# Patient Record
Sex: Female | Born: 1955 | Race: White | Hispanic: No | Marital: Married | Smoking: Never smoker
Health system: Southern US, Community
[De-identification: ages and names within clinical notes are randomized; demographics above are authoritative.]

## PROBLEM LIST (undated history)

## (undated) DIAGNOSIS — K219 Gastro-esophageal reflux disease without esophagitis: Secondary | ICD-10-CM

## (undated) DIAGNOSIS — K5909 Other constipation: Secondary | ICD-10-CM

## (undated) DIAGNOSIS — K623 Rectal prolapse: Secondary | ICD-10-CM

## (undated) DIAGNOSIS — M1990 Unspecified osteoarthritis, unspecified site: Secondary | ICD-10-CM

## (undated) HISTORY — DX: Rectal prolapse: K62.3

## (undated) HISTORY — PX: EYE SURGERY: SHX253

## (undated) HISTORY — DX: Other constipation: K59.09

## (undated) HISTORY — PX: COLONOSCOPY: SHX174

---

## 1997-08-07 ENCOUNTER — Ambulatory Visit (HOSPITAL_COMMUNITY): Admission: RE | Admit: 1997-08-07 | Discharge: 1997-08-07 | Payer: Self-pay | Admitting: Gynecology

## 1998-01-29 ENCOUNTER — Other Ambulatory Visit: Admission: RE | Admit: 1998-01-29 | Discharge: 1998-01-29 | Payer: Self-pay | Admitting: Gynecology

## 1998-08-05 ENCOUNTER — Ambulatory Visit (HOSPITAL_COMMUNITY): Admission: RE | Admit: 1998-08-05 | Discharge: 1998-08-05 | Payer: Self-pay | Admitting: Gynecology

## 1999-03-05 ENCOUNTER — Other Ambulatory Visit: Admission: RE | Admit: 1999-03-05 | Discharge: 1999-03-05 | Payer: Self-pay | Admitting: Gynecology

## 1999-08-11 ENCOUNTER — Encounter: Payer: Self-pay | Admitting: Gynecology

## 1999-08-11 ENCOUNTER — Ambulatory Visit (HOSPITAL_COMMUNITY): Admission: RE | Admit: 1999-08-11 | Discharge: 1999-08-11 | Payer: Self-pay | Admitting: Gynecology

## 2000-03-09 ENCOUNTER — Other Ambulatory Visit: Admission: RE | Admit: 2000-03-09 | Discharge: 2000-03-09 | Payer: Self-pay | Admitting: Gynecology

## 2000-08-11 ENCOUNTER — Encounter: Payer: Self-pay | Admitting: Gynecology

## 2000-08-11 ENCOUNTER — Ambulatory Visit (HOSPITAL_COMMUNITY): Admission: RE | Admit: 2000-08-11 | Discharge: 2000-08-11 | Payer: Self-pay | Admitting: Gynecology

## 2001-03-14 ENCOUNTER — Other Ambulatory Visit: Admission: RE | Admit: 2001-03-14 | Discharge: 2001-03-14 | Payer: Self-pay | Admitting: Gynecology

## 2001-03-17 ENCOUNTER — Encounter: Admission: RE | Admit: 2001-03-17 | Discharge: 2001-03-17 | Payer: Self-pay | Admitting: Gynecology

## 2001-03-17 ENCOUNTER — Encounter: Payer: Self-pay | Admitting: Gynecology

## 2002-04-14 ENCOUNTER — Other Ambulatory Visit: Admission: RE | Admit: 2002-04-14 | Discharge: 2002-04-14 | Payer: Self-pay | Admitting: Gynecology

## 2003-05-30 ENCOUNTER — Other Ambulatory Visit: Admission: RE | Admit: 2003-05-30 | Discharge: 2003-05-30 | Payer: Self-pay | Admitting: Gynecology

## 2003-07-13 ENCOUNTER — Ambulatory Visit (HOSPITAL_COMMUNITY): Admission: RE | Admit: 2003-07-13 | Discharge: 2003-07-13 | Payer: Self-pay | Admitting: Gynecology

## 2004-07-30 ENCOUNTER — Other Ambulatory Visit: Admission: RE | Admit: 2004-07-30 | Discharge: 2004-07-30 | Payer: Self-pay | Admitting: Gynecology

## 2005-08-28 ENCOUNTER — Ambulatory Visit (HOSPITAL_COMMUNITY): Admission: RE | Admit: 2005-08-28 | Discharge: 2005-08-28 | Payer: Self-pay | Admitting: Gynecology

## 2005-08-31 ENCOUNTER — Other Ambulatory Visit: Admission: RE | Admit: 2005-08-31 | Discharge: 2005-08-31 | Payer: Self-pay | Admitting: Gynecology

## 2006-08-31 ENCOUNTER — Ambulatory Visit (HOSPITAL_COMMUNITY): Admission: RE | Admit: 2006-08-31 | Discharge: 2006-08-31 | Payer: Self-pay | Admitting: Gynecology

## 2006-09-06 ENCOUNTER — Other Ambulatory Visit: Admission: RE | Admit: 2006-09-06 | Discharge: 2006-09-06 | Payer: Self-pay | Admitting: Gynecology

## 2007-09-09 ENCOUNTER — Ambulatory Visit (HOSPITAL_COMMUNITY): Admission: RE | Admit: 2007-09-09 | Discharge: 2007-09-09 | Payer: Self-pay | Admitting: Gynecology

## 2008-09-14 ENCOUNTER — Ambulatory Visit (HOSPITAL_COMMUNITY): Admission: RE | Admit: 2008-09-14 | Discharge: 2008-09-14 | Payer: Self-pay | Admitting: Gynecology

## 2009-03-26 ENCOUNTER — Ambulatory Visit: Payer: Self-pay | Admitting: Gastroenterology

## 2009-03-27 DIAGNOSIS — R1319 Other dysphagia: Secondary | ICD-10-CM | POA: Insufficient documentation

## 2009-04-08 ENCOUNTER — Ambulatory Visit (HOSPITAL_COMMUNITY): Admission: RE | Admit: 2009-04-08 | Discharge: 2009-04-08 | Payer: Self-pay | Admitting: Gastroenterology

## 2009-04-08 ENCOUNTER — Ambulatory Visit: Payer: Self-pay | Admitting: Gastroenterology

## 2009-05-21 ENCOUNTER — Ambulatory Visit: Payer: Self-pay | Admitting: Gastroenterology

## 2009-05-21 DIAGNOSIS — R1013 Epigastric pain: Secondary | ICD-10-CM

## 2009-05-21 DIAGNOSIS — K3189 Other diseases of stomach and duodenum: Secondary | ICD-10-CM | POA: Insufficient documentation

## 2009-06-08 DIAGNOSIS — K219 Gastro-esophageal reflux disease without esophagitis: Secondary | ICD-10-CM | POA: Insufficient documentation

## 2009-09-16 ENCOUNTER — Ambulatory Visit (HOSPITAL_COMMUNITY): Admission: RE | Admit: 2009-09-16 | Discharge: 2009-09-16 | Payer: Self-pay | Admitting: Gynecology

## 2010-01-06 ENCOUNTER — Ambulatory Visit: Payer: Self-pay | Admitting: Gastroenterology

## 2010-03-11 NOTE — Assessment & Plan Note (Signed)
Summary: DYSPHAGIA, DYSPEPSIA   Visit Type:  Follow-up Visit Primary Care Provider:  Sherril Croon, M.D.  Chief Complaint:  dysphagia.  History of Present Illness: Had a late supper yesterday. Pretty good fo rmost part. May have breakthrough Sx once a week. Takes two times a day when can remember. Recently went to beach and had more EtOH than usual-Margarita[employer] 2/day.  Current Medications (verified): 1)  Omeprazole 20 Mg Cpdr (Omeprazole) .... Take 1 Tablet By Mouth Two Times A Day 2)  Trazodone Hcl 50 Mg Tabs (Trazodone Hcl) .... 1/2 Tablet At Bedtime 3)  Multi-Vitamin .... Take 1 Tablet By Mouth Once A Day 4)  Vitamin D .... Take 1 Tablet By Mouth Once A Day 5)  Calcium 600 Mg Plus D .... Take 1 Tablet By Mouth Once A Day  Allergies (verified): 1)  ! 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  Vital Signs:  Patient profile:   55 year old female Height:      60.5 inches Weight:      124 pounds BMI:     23.90 Temp:     98.1 degrees F oral BP sitting:   110 / 70  (left arm) Cuff size:   regular  Vitals Entered By: Cloria Spring LPN (May 21, 2009 4:32 PM)  Physical Exam  General:  Well developed, well nourished, no acute distress. Head:  Normocephalic and atraumatic. Eyes:  PERRLA, no icterus. Mouth:  No deformity or lesions, dentition normal. Abdomen:  Soft, nontender and nondistended. Normal bowel sounds.  Impression & Recommendations:  Problem # 1:  DYSPHAGIA (ZOX-096.04) Assessment Improved Continue OMP minimum once every AM. Diet modification. Use OMP two times a day if having a flare.  Problem # 2:  DYSPEPSIA (ICD-536.8) Assessment: Improved Likely 2o to partially uncontrolled GERD and/or gastritis. Avoid gastric irritants. OPV in 4 mos.  CC:  PCP  Problem # 3:  SCREENING, COLON CANCER (ICD-V76.51) Assessment: Comment Only OBTAIN [facility] TCS REPORT.  cc: PCP  Other Orders: Est. Patient Level II (54098)     Appended Document: DYSPHAGIA, DYSPEPSIA 60m reminder appt made- cdg    Appended Document: Orders Update    Clinical Lists Changes  Orders: Added new Service order of Est. Patient Level II (11914) - Signed

## 2010-03-11 NOTE — Assessment & Plan Note (Signed)
Summary: DYSPHAGIA   Visit Type:  Initial Consult Primary Care Provider:  Sherril Croon, M.D.  Chief Complaint:  Feels like lump in throat.  History of Present Illness: This summer lump in her throat, hoarseness. OMP has helped. Stress test perfect. Still bothers her. Hurts in upr chest at night or when she lays down. No EGD. Feels heartburn: 2-3x/week. Gets the burps and s/t hb goes away with burping. Eats "healthy". No abd pain, diarrhea, or constipation. Real regular Intentional weight loss. No cigs. Rare glass wine. No smoked foods. Not always related to foods. Have had plapitations at night. Doesn't really matter of solids or liquids. OMP has helped but Sx persist ever now and then.   Current Medications (verified): 1)  Omeprazole 20 Mg Cpdr (Omeprazole) .... Take 1 Tablet By Mouth Once A Day 2)  Trazodone Hcl 50 Mg Tabs (Trazodone Hcl) .... 1/2 Tablet At Bedtime 3)  Multi-Vitamin .... Take 1 Tablet By Mouth Once A Day 4)  Vitamin D .... Take 1 Tablet By Mouth Once A Day  Allergies (verified): 1)  ! * Pencillin  Past History:  Past Medical History: Allergies Menopause TCS: NUR-8 years ago  Past Surgical History: No surgery  Family History: No Family History of Breast Cancer: No Family History of Colon Cancer: No FH of Colon Cancer or polyps No Family History of Ovarian Cancer: No Family History of Pancreatic Cancer: father has had esophagus stretched.  Social History: Occupation: principal asst at high school Married x 22 y 2 kids age 70 and 41  Vital Signs:  Patient profile:   55 year old female Height:      60.5 inches Weight:      126 pounds BMI:     24.29 Temp:     98.3 degrees F oral Pulse rate:   72 / minute BP sitting:   98 / 62  (left arm) Cuff size:   regular  Vitals Entered By: Cloria Spring LPN (March 26, 2009 3:09 PM)  Physical Exam  General:  Well developed, well nourished, no acute distress. Head:  Normocephalic and atraumatic. Lungs:  Clear  throughout to auscultation. Heart:  Regular rate and rhythm; no murmurs Abdomen:  Soft, nontender and nondistended.  Normal bowel sounds. Extremities:  No edema noted. Neurologic:  Alert and  oriented x4;  grossly normal neurologically.  Impression & Recommendations:  Problem # 1:  DYSPHAGIA (ICD-787.29) Assessment New Differetial diagnosis includes stricture, ring, and less likely esophageal mass. EGD in 1-2 weeks. Continue OMP. OPV in 2 mos.  Orders: Consultation Level III (11914)  CC: PCP

## 2010-03-11 NOTE — Letter (Signed)
Summary: EGD ORDER  EGD ORDER   Imported By: Ave Filter 03/26/2009 15:58:20  _____________________________________________________________________  External Attachment:    Type:   Image     Comment:   External Document

## 2010-03-13 NOTE — Assessment & Plan Note (Signed)
Summary: GERD, DYSPHAGIA   Visit Type:  Follow-up Visit Primary Care Provider:  Sherril Croon, M.D.  Chief Complaint:  follow up- doing ok.  History of Present Illness: No real questions. Taking OMP once a day because the insurance co wouldn't pay for two times a day. Having breakthrough Sx once a week. Foods causing Sx: chocolate, ? caffeine (> 2-3 cups a day). Forgot to take meds and Sx worse on today. usu takes her meds with breakfast. Problems with chest pain and swallowing now tha Sx not ideally controlled.   Current Medications (verified): 1)  Omeprazole 20 Mg Cpdr (Omeprazole) .... Once Daily 2)  Trazodone Hcl 50 Mg Tabs (Trazodone Hcl) .... 1/2 Tablet At Bedtime As Needed 3)  Vitamin D .... Take 1 Tablet By Mouth Once A Day 4)  Calcium 600 Mg Plus D .... Take 1 Tablet By Mouth Once A Day  Allergies (verified): 1)  ! 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 Head:  Normocephalic and atraumatic. Lungs:  Clear throughout to auscultation. Heart:  Regular rate and rhythm; no murmurs. Abdomen:  Soft, nontender and nondistended. Normal bowel sounds.  Impression & Recommendations:  Problem # 1:  GERD (ICD-530.81) Assessment Improved Continue OMP daily. Meds refilled x 1 year. OPV in 12 mos.  Problem # 2:  DYSPHAGIA (VWU-981.19) Assessment: Comment Only Resolved.  CC: PCP Prescriptions: OMEPRAZOLE 20 MG CPDR (OMEPRAZOLE) once daily  #30 x  11   Entered and Authorized by:   West Bali MD   Signed by:   West Bali MD on 01/06/2010   Method used:   Electronically to        [facility] (retail)       [address]       [place], [place]  [address]       Ph: [phone] or [phone]       Fax: [phone]   RxID:   [id number]   Appended Document: Orders Update    Clinical Lists Changes  Orders: Added new Service order of Est. Patient Level II (95638) - Signed

## 2010-04-28 ENCOUNTER — Encounter: Payer: Self-pay | Admitting: Urgent Care

## 2010-05-08 NOTE — Medication Information (Signed)
Summary: PA for omeprazole  PA for omeprazole   Imported By: Hendricks Limes LPN 16/11/9602 54:09:81  _____________________________________________________________________  External Attachment:    Type:   Image     Comment:   External Document

## 2010-08-11 ENCOUNTER — Other Ambulatory Visit (HOSPITAL_COMMUNITY): Payer: Self-pay | Admitting: Gynecology

## 2010-08-11 DIAGNOSIS — Z139 Encounter for screening, unspecified: Secondary | ICD-10-CM

## 2010-09-22 ENCOUNTER — Ambulatory Visit (HOSPITAL_COMMUNITY)
Admission: RE | Admit: 2010-09-22 | Discharge: 2010-09-22 | Disposition: A | Discharge status: Home / Self Care | Payer: BC Managed Care – PPO | Source: Ambulatory Visit | Attending: Gynecology | Admitting: Gynecology

## 2010-09-22 DIAGNOSIS — Z139 Encounter for screening, unspecified: Secondary | ICD-10-CM

## 2010-09-22 DIAGNOSIS — Z1231 Encounter for screening mammogram for malignant neoplasm of breast: Secondary | ICD-10-CM | POA: Insufficient documentation

## 2010-11-10 ENCOUNTER — Encounter: Payer: Self-pay | Admitting: Gastroenterology

## 2010-12-08 ENCOUNTER — Other Ambulatory Visit: Payer: Self-pay | Admitting: Gynecology

## 2011-10-06 ENCOUNTER — Other Ambulatory Visit (HOSPITAL_COMMUNITY): Payer: Self-pay | Admitting: Gynecology

## 2011-10-06 DIAGNOSIS — Z139 Encounter for screening, unspecified: Secondary | ICD-10-CM

## 2011-10-19 ENCOUNTER — Ambulatory Visit (HOSPITAL_COMMUNITY)
Admission: RE | Admit: 2011-10-19 | Discharge: 2011-10-19 | Disposition: A | Discharge status: Home / Self Care | Payer: BC Managed Care – PPO | Source: Ambulatory Visit | Attending: Gynecology | Admitting: Gynecology

## 2011-10-19 DIAGNOSIS — Z139 Encounter for screening, unspecified: Secondary | ICD-10-CM

## 2011-10-19 DIAGNOSIS — Z1231 Encounter for screening mammogram for malignant neoplasm of breast: Secondary | ICD-10-CM | POA: Insufficient documentation

## 2012-01-01 ENCOUNTER — Other Ambulatory Visit: Payer: Self-pay | Admitting: Gynecology

## 2012-09-23 ENCOUNTER — Other Ambulatory Visit (HOSPITAL_COMMUNITY): Payer: Self-pay | Admitting: Internal Medicine

## 2012-09-23 DIAGNOSIS — Z139 Encounter for screening, unspecified: Secondary | ICD-10-CM

## 2012-10-24 ENCOUNTER — Ambulatory Visit (HOSPITAL_COMMUNITY)
Admission: RE | Admit: 2012-10-24 | Discharge: 2012-10-24 | Disposition: A | Discharge status: Home / Self Care | Payer: BC Managed Care – PPO | Source: Ambulatory Visit | Attending: Internal Medicine | Admitting: Internal Medicine

## 2012-10-24 DIAGNOSIS — Z1231 Encounter for screening mammogram for malignant neoplasm of breast: Secondary | ICD-10-CM | POA: Insufficient documentation

## 2012-10-24 DIAGNOSIS — Z139 Encounter for screening, unspecified: Secondary | ICD-10-CM

## 2013-11-14 ENCOUNTER — Other Ambulatory Visit (HOSPITAL_COMMUNITY): Payer: Self-pay | Admitting: Obstetrics and Gynecology

## 2013-11-14 DIAGNOSIS — Z139 Encounter for screening, unspecified: Secondary | ICD-10-CM

## 2013-12-06 ENCOUNTER — Ambulatory Visit (HOSPITAL_COMMUNITY)
Admission: RE | Admit: 2013-12-06 | Discharge: 2013-12-06 | Disposition: A | Discharge status: Home / Self Care | Payer: BC Managed Care – PPO | Source: Ambulatory Visit | Attending: Obstetrics and Gynecology | Admitting: Obstetrics and Gynecology

## 2013-12-06 DIAGNOSIS — Z1231 Encounter for screening mammogram for malignant neoplasm of breast: Secondary | ICD-10-CM | POA: Insufficient documentation

## 2013-12-06 DIAGNOSIS — Z139 Encounter for screening, unspecified: Secondary | ICD-10-CM

## 2013-12-07 ENCOUNTER — Ambulatory Visit (HOSPITAL_COMMUNITY): Payer: BC Managed Care – PPO

## 2015-03-28 ENCOUNTER — Other Ambulatory Visit (HOSPITAL_COMMUNITY): Payer: Self-pay | Admitting: Obstetrics and Gynecology

## 2015-03-28 DIAGNOSIS — Z1231 Encounter for screening mammogram for malignant neoplasm of breast: Secondary | ICD-10-CM

## 2015-04-15 ENCOUNTER — Ambulatory Visit (HOSPITAL_COMMUNITY)
Admission: RE | Admit: 2015-04-15 | Discharge: 2015-04-15 | Disposition: A | Discharge status: Home / Self Care | Payer: BC Managed Care – PPO | Source: Ambulatory Visit | Attending: Obstetrics and Gynecology | Admitting: Obstetrics and Gynecology

## 2015-04-15 DIAGNOSIS — Z1231 Encounter for screening mammogram for malignant neoplasm of breast: Secondary | ICD-10-CM | POA: Diagnosis present

## 2015-04-19 ENCOUNTER — Other Ambulatory Visit: Payer: Self-pay | Admitting: Obstetrics and Gynecology

## 2015-04-19 DIAGNOSIS — R928 Other abnormal and inconclusive findings on diagnostic imaging of breast: Secondary | ICD-10-CM

## 2015-05-01 ENCOUNTER — Ambulatory Visit
Admission: RE | Admit: 2015-05-01 | Discharge: 2015-05-01 | Disposition: A | Discharge status: Home / Self Care | Payer: BC Managed Care – PPO | Source: Ambulatory Visit | Attending: Obstetrics and Gynecology | Admitting: Obstetrics and Gynecology

## 2015-05-01 DIAGNOSIS — R928 Other abnormal and inconclusive findings on diagnostic imaging of breast: Secondary | ICD-10-CM

## 2019-01-19 ENCOUNTER — Other Ambulatory Visit: Payer: Self-pay

## 2019-01-19 DIAGNOSIS — Z20822 Contact with and (suspected) exposure to covid-19: Secondary | ICD-10-CM

## 2019-01-20 LAB — NOVEL CORONAVIRUS, NAA: SARS-CoV-2, NAA: DETECTED — AB

## 2019-04-09 ENCOUNTER — Other Ambulatory Visit: Payer: Self-pay

## 2019-04-09 ENCOUNTER — Ambulatory Visit: Payer: BC Managed Care – PPO | Attending: Internal Medicine

## 2019-04-09 DIAGNOSIS — Z23 Encounter for immunization: Secondary | ICD-10-CM | POA: Insufficient documentation

## 2019-04-09 NOTE — Progress Notes (Signed)
   Covid-19 Vaccination Clinic  Name:  Kathy Bennett    MRN: [id number] DOB: Feb 13, 1955  04/09/2019  Ms. [name] was observed post Covid-19 immunization for 15 minutes without incidence. She was provided with Vaccine Information Sheet and instruction to access the V-Safe system.   Ms. [name] was instructed to call 911 with any severe reactions post vaccine: [name] Kitchen Difficulty breathing  . Swelling of your face and throat  . A fast heartbeat  . A bad rash all over your body  . Dizziness and weakness    Immunizations Administered    Name Date Dose VIS Date Route   Moderna COVID-19 Vaccine 04/09/2019  9:36 AM 0.5 mL 01/10/2019 Intramuscular   Manufacturer: Moderna   Lot: [id number]   [name]

## 2019-05-13 ENCOUNTER — Ambulatory Visit: Payer: BC Managed Care – PPO | Attending: Internal Medicine

## 2019-05-13 DIAGNOSIS — Z23 Encounter for immunization: Secondary | ICD-10-CM

## 2019-05-13 NOTE — Progress Notes (Signed)
   Covid-19 Vaccination Clinic  Name:  Kathy Bennett    MRN: [id number] DOB: 1955-07-16  05/13/2019  [name] was observed post Covid-19 immunization for 15 minutes without incident. She was provided with Vaccine Information Sheet and instruction to access the V-Safe system.   [name] was instructed to call 911 with any severe reactions post vaccine: [name] Kitchen Difficulty breathing  . Swelling of face and throat  . A fast heartbeat  . A bad rash all over body  . Dizziness and weakness   Immunizations Administered    Name Date Dose VIS Date Route   Moderna COVID-19 Vaccine 05/13/2019  9:29 AM 0.5 mL 01/10/2019 Intramuscular   Manufacturer: Moderna   Lot: [id number]   [id number]

## 2019-10-24 ENCOUNTER — Other Ambulatory Visit (HOSPITAL_COMMUNITY): Payer: Self-pay | Admitting: Obstetrics and Gynecology

## 2019-10-24 DIAGNOSIS — Z1211 Encounter for screening for malignant neoplasm of colon: Secondary | ICD-10-CM | POA: Diagnosis not present

## 2019-10-24 DIAGNOSIS — E2839 Other primary ovarian failure: Secondary | ICD-10-CM | POA: Diagnosis not present

## 2019-10-24 DIAGNOSIS — E559 Vitamin D deficiency, unspecified: Secondary | ICD-10-CM | POA: Diagnosis not present

## 2019-10-24 DIAGNOSIS — Z7189 Other specified counseling: Secondary | ICD-10-CM | POA: Diagnosis not present

## 2019-10-24 DIAGNOSIS — R5383 Other fatigue: Secondary | ICD-10-CM | POA: Diagnosis not present

## 2019-10-24 DIAGNOSIS — Z1339 Encounter for screening examination for other mental health and behavioral disorders: Secondary | ICD-10-CM | POA: Diagnosis not present

## 2019-10-24 DIAGNOSIS — Z6823 Body mass index (BMI) 23.0-23.9, adult: Secondary | ICD-10-CM | POA: Diagnosis not present

## 2019-10-24 DIAGNOSIS — Z1231 Encounter for screening mammogram for malignant neoplasm of breast: Secondary | ICD-10-CM

## 2019-10-24 DIAGNOSIS — Z79899 Other long term (current) drug therapy: Secondary | ICD-10-CM | POA: Diagnosis not present

## 2019-10-24 DIAGNOSIS — Z0000 Encounter for general adult medical examination without abnormal findings: Secondary | ICD-10-CM | POA: Diagnosis not present

## 2019-10-24 DIAGNOSIS — Z299 Encounter for prophylactic measures, unspecified: Secondary | ICD-10-CM | POA: Diagnosis not present

## 2019-10-24 DIAGNOSIS — Z1331 Encounter for screening for depression: Secondary | ICD-10-CM | POA: Diagnosis not present

## 2019-11-06 DIAGNOSIS — L820 Inflamed seborrheic keratosis: Secondary | ICD-10-CM | POA: Diagnosis not present

## 2019-11-06 DIAGNOSIS — C44311 Basal cell carcinoma of skin of nose: Secondary | ICD-10-CM | POA: Diagnosis not present

## 2019-11-06 DIAGNOSIS — I781 Nevus, non-neoplastic: Secondary | ICD-10-CM | POA: Diagnosis not present

## 2019-11-09 DIAGNOSIS — R69 Illness, unspecified: Secondary | ICD-10-CM | POA: Diagnosis not present

## 2019-11-23 DIAGNOSIS — R3915 Urgency of urination: Secondary | ICD-10-CM | POA: Diagnosis not present

## 2019-11-23 DIAGNOSIS — G4700 Insomnia, unspecified: Secondary | ICD-10-CM | POA: Diagnosis not present

## 2019-11-23 DIAGNOSIS — Z01419 Encounter for gynecological examination (general) (routine) without abnormal findings: Secondary | ICD-10-CM | POA: Diagnosis not present

## 2019-11-23 DIAGNOSIS — N951 Menopausal and female climacteric states: Secondary | ICD-10-CM | POA: Diagnosis not present

## 2019-12-04 ENCOUNTER — Ambulatory Visit (HOSPITAL_COMMUNITY): Payer: BC Managed Care – PPO

## 2019-12-11 DIAGNOSIS — C4441 Basal cell carcinoma of skin of scalp and neck: Secondary | ICD-10-CM | POA: Diagnosis not present

## 2019-12-11 DIAGNOSIS — C44311 Basal cell carcinoma of skin of nose: Secondary | ICD-10-CM | POA: Diagnosis not present

## 2019-12-18 ENCOUNTER — Other Ambulatory Visit: Payer: Self-pay

## 2019-12-18 ENCOUNTER — Ambulatory Visit (HOSPITAL_COMMUNITY)
Admission: RE | Admit: 2019-12-18 | Discharge: 2019-12-18 | Disposition: A | Discharge status: Home / Self Care | Payer: Medicare HMO | Source: Ambulatory Visit | Attending: Obstetrics and Gynecology | Admitting: Obstetrics and Gynecology

## 2019-12-18 DIAGNOSIS — Z1231 Encounter for screening mammogram for malignant neoplasm of breast: Secondary | ICD-10-CM | POA: Diagnosis not present

## 2019-12-19 DIAGNOSIS — N959 Unspecified menopausal and perimenopausal disorder: Secondary | ICD-10-CM | POA: Diagnosis not present

## 2019-12-19 DIAGNOSIS — E2839 Other primary ovarian failure: Secondary | ICD-10-CM | POA: Diagnosis not present

## 2019-12-28 DIAGNOSIS — H40003 Preglaucoma, unspecified, bilateral: Secondary | ICD-10-CM | POA: Diagnosis not present

## 2020-02-12 DIAGNOSIS — Z08 Encounter for follow-up examination after completed treatment for malignant neoplasm: Secondary | ICD-10-CM | POA: Diagnosis not present

## 2020-02-12 DIAGNOSIS — L308 Other specified dermatitis: Secondary | ICD-10-CM | POA: Diagnosis not present

## 2020-02-12 DIAGNOSIS — Z85828 Personal history of other malignant neoplasm of skin: Secondary | ICD-10-CM | POA: Diagnosis not present

## 2020-05-21 DIAGNOSIS — H5210 Myopia, unspecified eye: Secondary | ICD-10-CM | POA: Diagnosis not present

## 2020-05-29 DIAGNOSIS — Z0100 Encounter for examination of eyes and vision without abnormal findings: Secondary | ICD-10-CM | POA: Diagnosis not present

## 2020-07-23 DIAGNOSIS — Z20822 Contact with and (suspected) exposure to covid-19: Secondary | ICD-10-CM | POA: Diagnosis not present

## 2020-09-16 DIAGNOSIS — L308 Other specified dermatitis: Secondary | ICD-10-CM | POA: Diagnosis not present

## 2020-09-16 DIAGNOSIS — R208 Other disturbances of skin sensation: Secondary | ICD-10-CM | POA: Diagnosis not present

## 2020-09-16 DIAGNOSIS — C44311 Basal cell carcinoma of skin of nose: Secondary | ICD-10-CM | POA: Diagnosis not present

## 2020-10-17 DIAGNOSIS — Z85828 Personal history of other malignant neoplasm of skin: Secondary | ICD-10-CM | POA: Diagnosis not present

## 2020-10-17 DIAGNOSIS — Z08 Encounter for follow-up examination after completed treatment for malignant neoplasm: Secondary | ICD-10-CM | POA: Diagnosis not present

## 2020-10-31 DIAGNOSIS — Z789 Other specified health status: Secondary | ICD-10-CM | POA: Diagnosis not present

## 2020-10-31 DIAGNOSIS — E7800 Pure hypercholesterolemia, unspecified: Secondary | ICD-10-CM | POA: Diagnosis not present

## 2020-10-31 DIAGNOSIS — Z7189 Other specified counseling: Secondary | ICD-10-CM | POA: Diagnosis not present

## 2020-10-31 DIAGNOSIS — Z299 Encounter for prophylactic measures, unspecified: Secondary | ICD-10-CM | POA: Diagnosis not present

## 2020-10-31 DIAGNOSIS — R5383 Other fatigue: Secondary | ICD-10-CM | POA: Diagnosis not present

## 2020-10-31 DIAGNOSIS — Z1339 Encounter for screening examination for other mental health and behavioral disorders: Secondary | ICD-10-CM | POA: Diagnosis not present

## 2020-10-31 DIAGNOSIS — Z6823 Body mass index (BMI) 23.0-23.9, adult: Secondary | ICD-10-CM | POA: Diagnosis not present

## 2020-10-31 DIAGNOSIS — Z79899 Other long term (current) drug therapy: Secondary | ICD-10-CM | POA: Diagnosis not present

## 2020-10-31 DIAGNOSIS — Z1331 Encounter for screening for depression: Secondary | ICD-10-CM | POA: Diagnosis not present

## 2020-10-31 DIAGNOSIS — Z0000 Encounter for general adult medical examination without abnormal findings: Secondary | ICD-10-CM | POA: Diagnosis not present

## 2020-11-06 ENCOUNTER — Encounter (INDEPENDENT_AMBULATORY_CARE_PROVIDER_SITE_OTHER): Payer: Self-pay | Admitting: *Deleted

## 2020-11-15 ENCOUNTER — Encounter: Payer: Self-pay | Admitting: *Deleted

## 2020-11-15 ENCOUNTER — Other Ambulatory Visit (HOSPITAL_COMMUNITY): Payer: Self-pay | Admitting: Internal Medicine

## 2020-11-15 DIAGNOSIS — Z1231 Encounter for screening mammogram for malignant neoplasm of breast: Secondary | ICD-10-CM

## 2020-11-25 ENCOUNTER — Telehealth: Payer: Self-pay | Admitting: Internal Medicine

## 2020-11-25 NOTE — Telephone Encounter (Signed)
Patient did not receive her questionairrem, please mail her another

## 2020-11-25 NOTE — Telephone Encounter (Signed)
Pt has nurse visit scheduled for 12/16/2020 at 3:00.

## 2020-12-10 ENCOUNTER — Ambulatory Visit: Payer: BC Managed Care – PPO

## 2020-12-16 ENCOUNTER — Other Ambulatory Visit: Payer: Self-pay

## 2020-12-16 ENCOUNTER — Ambulatory Visit (INDEPENDENT_AMBULATORY_CARE_PROVIDER_SITE_OTHER): Payer: Self-pay | Admitting: *Deleted

## 2020-12-16 ENCOUNTER — Encounter: Payer: Self-pay | Admitting: *Deleted

## 2020-12-16 VITALS — Ht 60.5 in | Wt 123.2 lb

## 2020-12-16 DIAGNOSIS — Z86010 Personal history of colonic polyps: Secondary | ICD-10-CM

## 2020-12-16 MED ORDER — NA SULFATE-K SULFATE-MG SULF 17.5-3.13-1.6 GM/177ML PO SOLN: 1.0000 | Freq: Once | ORAL | 0 refills | Status: AC

## 2020-12-16 NOTE — Progress Notes (Addendum)
Faxed request for records to [facility] to obtain last TCS report.  Pt had EGD done in 2011 by Dr. Oneida Alar.  EGD report in Epic.

## 2020-12-16 NOTE — Progress Notes (Addendum)
Gastroenterology Pre-Procedure Review  Request Date: 12/16/2020 Requesting Physician: Dr. Woody Seller @ [facility], Last TCS 12/31/2014 done by Dr. 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 Diabetes Melitis: no 2. Joint replacements in the past 12 months: no 3. Major health problems in the past 3 months: no 4. Has an artificial valve or MVP: no 5. Has a defibrillator: no 6. Has been advised in past to take antibiotics in advance of a procedure like teeth cleaning: no 7. Family history of colon cancer: no 8. Alcohol Use: yes, 2 glasses of wine a month 9. Illicit drug Use: no 10. History of sleep apnea: no 11. History of coronary artery or other vascular stents placed within the last 12 months: no 12. History of any prior anesthesia complications: no 13. Body mass index is 23.66 kg/m.    MEDICATIONS & ALLERGIES:    Patient reports the following regarding taking any blood thinners:   Plavix? no Aspirin? no Coumadin? no Brilinta? no Xarelto? no Eliquis? no Pradaxa? no Savaysa? no Effient? no  Patient confirms/reports the following medications:  Current Outpatient Medications  Medication Sig Dispense Refill   CALCIUM-VITAMIN D PO Take by mouth daily at 6 (six) AM.     No current facility-administered medications for this visit.    Patient confirms/reports the following allergies:  Allergies  Allergen Reactions   Penicillins     No orders of the defined types were placed in this encounter.   AUTHORIZATION INFORMATION Primary Insurance: [employer],  ID #: [id number],  Group #: [id number] [place] Pre-Cert / Auth required:  No, not required  SCHEDULE INFORMATION: Procedure has been scheduled as follows:  Date: 01/21/2021, Time: 9:45  Location: [facility] with Dr.  Abbey Chatters  This Gastroenterology Pre-Precedure Review Form is being routed to the following provider(s): Aliene Altes, PA-C

## 2020-12-17 ENCOUNTER — Telehealth: Payer: Self-pay | Admitting: *Deleted

## 2020-12-17 NOTE — Telephone Encounter (Signed)
Spoke to pt.  She wanted to know alternative preps so she could check with her pharmacy to see the best cost for her.  She really did not want to do Trilyte.  She told me to keep everything as is for now and would let me know if she wants me to change it.

## 2020-12-17 NOTE — Progress Notes (Signed)
Will await records from [place] for review.

## 2020-12-17 NOTE — Telephone Encounter (Signed)
[phone]  please call patient, she has insurance concerns

## 2020-12-19 ENCOUNTER — Encounter: Payer: Self-pay | Admitting: *Deleted

## 2020-12-19 NOTE — Progress Notes (Signed)
Received records and placed in [place], [place] office for review.

## 2020-12-23 NOTE — Progress Notes (Deleted)
Records have been received.  Placed in Aliene Altes, PA-C's office for review.

## 2020-12-24 NOTE — Progress Notes (Signed)
Received and reviewed colonoscopy records from November 2016.  Patient had a 6 mm sessile polyp in the sigmoid colon resected and retrieved, patchy area of moderately erythematous mucosa in the rectum biopsied, normal TI and otherwise normal normal exam including normal TI.  Pathology with tubular adenoma and benign rectal mucosa with ischemic pattern injury, no chronic colitis or granulomatosis inflammation. Recommended repeat in 5 years.   OK to schedule colonoscopy. ASA I/II

## 2020-12-25 ENCOUNTER — Other Ambulatory Visit: Payer: Self-pay

## 2020-12-25 ENCOUNTER — Ambulatory Visit (HOSPITAL_COMMUNITY)
Admission: RE | Admit: 2020-12-25 | Discharge: 2020-12-25 | Disposition: A | Discharge status: Home / Self Care | Payer: Medicare HMO | Source: Ambulatory Visit | Attending: Internal Medicine | Admitting: Internal Medicine

## 2020-12-25 DIAGNOSIS — Z1231 Encounter for screening mammogram for malignant neoplasm of breast: Secondary | ICD-10-CM | POA: Diagnosis not present

## 2021-01-08 DIAGNOSIS — G8929 Other chronic pain: Secondary | ICD-10-CM | POA: Diagnosis not present

## 2021-01-08 DIAGNOSIS — M549 Dorsalgia, unspecified: Secondary | ICD-10-CM | POA: Diagnosis not present

## 2021-01-08 DIAGNOSIS — R32 Unspecified urinary incontinence: Secondary | ICD-10-CM | POA: Diagnosis not present

## 2021-01-08 DIAGNOSIS — Z7722 Contact with and (suspected) exposure to environmental tobacco smoke (acute) (chronic): Secondary | ICD-10-CM | POA: Diagnosis not present

## 2021-01-08 DIAGNOSIS — Z880 Allergy status to penicillin: Secondary | ICD-10-CM | POA: Diagnosis not present

## 2021-01-08 DIAGNOSIS — Z8249 Family history of ischemic heart disease and other diseases of the circulatory system: Secondary | ICD-10-CM | POA: Diagnosis not present

## 2021-01-08 DIAGNOSIS — R030 Elevated blood-pressure reading, without diagnosis of hypertension: Secondary | ICD-10-CM | POA: Diagnosis not present

## 2021-01-08 DIAGNOSIS — Z833 Family history of diabetes mellitus: Secondary | ICD-10-CM | POA: Diagnosis not present

## 2021-01-21 ENCOUNTER — Other Ambulatory Visit: Payer: Self-pay

## 2021-01-21 ENCOUNTER — Ambulatory Visit (HOSPITAL_COMMUNITY): Payer: Medicare HMO | Admitting: Anesthesiology

## 2021-01-21 ENCOUNTER — Encounter (HOSPITAL_COMMUNITY): Payer: Self-pay

## 2021-01-21 ENCOUNTER — Encounter (HOSPITAL_COMMUNITY)
Admission: RE | Disposition: A | Discharge status: Home / Self Care | Payer: Self-pay | Source: Home / Self Care | Attending: Internal Medicine

## 2021-01-21 ENCOUNTER — Ambulatory Visit (HOSPITAL_COMMUNITY)
Admission: RE | Admit: 2021-01-21 | Discharge: 2021-01-21 | Disposition: A | Discharge status: Home / Self Care | Payer: Medicare HMO | Attending: Internal Medicine | Admitting: Internal Medicine

## 2021-01-21 DIAGNOSIS — Z86010 Personal history of colonic polyps: Secondary | ICD-10-CM | POA: Diagnosis not present

## 2021-01-21 DIAGNOSIS — K219 Gastro-esophageal reflux disease without esophagitis: Secondary | ICD-10-CM | POA: Diagnosis not present

## 2021-01-21 DIAGNOSIS — Z1211 Encounter for screening for malignant neoplasm of colon: Secondary | ICD-10-CM | POA: Insufficient documentation

## 2021-01-21 DIAGNOSIS — K5730 Diverticulosis of large intestine without perforation or abscess without bleeding: Secondary | ICD-10-CM | POA: Diagnosis not present

## 2021-01-21 DIAGNOSIS — K6289 Other specified diseases of anus and rectum: Secondary | ICD-10-CM | POA: Insufficient documentation

## 2021-01-21 HISTORY — PX: BIOPSY: SHX5522

## 2021-01-21 HISTORY — PX: COLONOSCOPY WITH PROPOFOL: SHX5780

## 2021-01-21 SURGERY — COLONOSCOPY WITH PROPOFOL
Anesthesia: General

## 2021-01-21 MED ORDER — PROPOFOL 10 MG/ML IV BOLUS
INTRAVENOUS | Status: DC | PRN
  Administered 2021-01-21: 30 mg via INTRAVENOUS
  Administered 2021-01-21 (×2): 40 mg via INTRAVENOUS
  Administered 2021-01-21: 30 mg via INTRAVENOUS
  Administered 2021-01-21: 60 mg via INTRAVENOUS
  Administered 2021-01-21: 100 mg via INTRAVENOUS
  Administered 2021-01-21: 40 mg via INTRAVENOUS

## 2021-01-21 MED ORDER — LACTATED RINGERS IV SOLN: INTRAVENOUS | Status: DC

## 2021-01-21 MED ORDER — LIDOCAINE HCL (CARDIAC) PF 100 MG/5ML IV SOSY
PREFILLED_SYRINGE | INTRAVENOUS | Status: DC | PRN
  Administered 2021-01-21: 50 mg via INTRAVENOUS

## 2021-01-21 NOTE — Anesthesia Procedure Notes (Signed)
Date/Time: 01/21/2021 9:47 AM Performed by: Orlie Dakin, CRNA Pre-anesthesia Checklist: Emergency Drugs available, Patient identified, Suction available and Patient being monitored Patient Re-evaluated:Patient Re-evaluated prior to induction Oxygen Delivery Method: Nasal cannula Induction Type: IV induction Placement Confirmation: positive ETCO2

## 2021-01-21 NOTE — Anesthesia Preprocedure Evaluation (Addendum)
Anesthesia Evaluation  Patient identified by MRN, date of birth, ID band Patient awake    Reviewed: Allergy & Precautions, H&P , NPO status , Patient's Chart, lab work & pertinent test results  Airway Mallampati: II  TM Distance: >3 FB Neck ROM: Full    Dental  (+) Dental Advisory Given, Teeth Intact   Pulmonary neg pulmonary ROS,    Pulmonary exam normal breath sounds clear to auscultation       Cardiovascular negative cardio ROS Normal cardiovascular exam Rhythm:Regular Rate:Normal     Neuro/Psych negative neurological ROS  negative psych ROS   GI/Hepatic Neg liver ROS, GERD  ,  Endo/Other  negative endocrine ROS  Renal/GU negative Renal ROS  negative genitourinary   Musculoskeletal negative musculoskeletal ROS (+)   Abdominal   Peds negative pediatric ROS (+)  Hematology negative hematology ROS (+)   Anesthesia Other Findings   Reproductive/Obstetrics negative OB ROS                            Anesthesia Physical Anesthesia Plan  ASA: 1  Anesthesia Plan: General   Post-op Pain Management: Minimal or no pain anticipated   Induction:   PONV Risk Score and Plan: TIVA  Airway Management Planned: Nasal Cannula and Natural Airway  Additional Equipment:   Intra-op Plan:   Post-operative Plan:   Informed Consent: I have reviewed the patients History and Physical, chart, labs and discussed the procedure including the risks, benefits and alternatives for the proposed anesthesia with the patient or authorized representative who has indicated his/her understanding and acceptance.     Dental advisory given  Plan Discussed with: CRNA and Surgeon  Anesthesia Plan Comments:         Anesthesia Quick Evaluation

## 2021-01-21 NOTE — Discharge Instructions (Addendum)
Colonoscopy Discharge Instructions  Read the instructions outlined below and refer to this sheet in the next few weeks. These discharge instructions provide you with general information on caring for yourself after you leave the hospital. Your doctor may also give you specific instructions. While your treatment has been planned according to the most current medical practices available, unavoidable complications occasionally occur.   ACTIVITY You may resume your regular activity, but move at a slower pace for the next 24 hours.  Take frequent rest periods for the next 24 hours.  Walking will help get rid of the air and reduce the bloated feeling in your belly (abdomen).  No driving for 24 hours (because of the medicine (anesthesia) used during the test).   Do not sign any important legal documents or operate any machinery for 24 hours (because of the anesthesia used during the test).  NUTRITION Drink plenty of fluids.  You may resume your normal diet as instructed by your doctor.  Begin with a light meal and progress to your normal diet. Heavy or fried foods are harder to digest and may make you feel sick to your stomach (nauseated).  Avoid alcoholic beverages for 24 hours or as instructed.  MEDICATIONS You may resume your normal medications unless your doctor tells you otherwise.  WHAT YOU CAN EXPECT TODAY Some feelings of bloating in the abdomen.  Passage of more gas than usual.  Spotting of blood in your stool or on the toilet paper.  IF YOU HAD POLYPS REMOVED DURING THE COLONOSCOPY: No aspirin products for 7 days or as instructed.  No alcohol for 7 days or as instructed.  Eat a soft diet for the next 24 hours.  FINDING OUT THE RESULTS OF YOUR TEST Not all test results are available during your visit. If your test results are not back during the visit, make an appointment with your caregiver to find out the results. Do not assume everything is normal if you have not heard from your  caregiver or the medical facility. It is important for you to follow up on all of your test results.  SEEK IMMEDIATE MEDICAL ATTENTION IF: You have more than a spotting of blood in your stool.  Your belly is swollen (abdominal distention).  You are nauseated or vomiting.  You have a temperature over 101.  You have abdominal pain or discomfort that is severe or gets worse throughout the day.   I did not find any polyps or evidence of colon cancer throughout your colon today.  You do have significant inflammation as well as ulceration in your rectum.  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  See bowel regimen below.  I did take extensive biopsies of this area and will contact you with these results.  Follow-up with Dr. Abbey Chatters in 3 months. Office will notify you of appointment and send you a letter.  For your constipation, I want you to start taking over the counter MiraLAX 1 capful daily.  If this does not adequately control your constipation, I would increase to 2 capfuls daily.  You can also add Dulcolax on top of this as well one time daily.    I also recommend increasing fiber in your diet or adding OTC Benefiber/Metamucil. Be sure to drink at least 4 to 6 glasses of water daily.   I hope you have a great rest of your week!  Elon Alas. Abbey Chatters, D.O. Gastroenterology and Hepatology [facility]

## 2021-01-21 NOTE — Anesthesia Postprocedure Evaluation (Signed)
Anesthesia Post Note  Patient: Kathy Bennett  Procedure(s) Performed: COLONOSCOPY WITH PROPOFOL BIOPSY  Patient location during evaluation: PACU Anesthesia Type: General Level of consciousness: awake and alert and oriented Pain management: pain level controlled Vital Signs Assessment: post-procedure vital signs reviewed and stable Respiratory status: spontaneous breathing, nonlabored ventilation and respiratory function stable Cardiovascular status: blood pressure returned to baseline and stable Postop Assessment: no apparent nausea or vomiting Anesthetic complications: no   No notable events documented.   Last Vitals:  Vitals:   01/21/21 0834 01/21/21 1020  BP: 109/63 (!) 91/51  Pulse:  69  Resp: 16 16  Temp: 36.9 C 36.5 C  SpO2: 100% 100%    Last Pain:  Vitals:   01/21/21 1020  TempSrc: Oral  PainSc: 0-No pain                 Jodi Criscuolo C Areli Frary

## 2021-01-21 NOTE — Transfer of Care (Signed)
Immediate Anesthesia Transfer of Care Note  Patient: Kathy Bennett  Procedure(s) Performed: COLONOSCOPY WITH PROPOFOL BIOPSY  Patient Location: Endoscopy Unit  Anesthesia Type:General  Level of Consciousness: awake  Airway & Oxygen Therapy: Patient Spontanous Breathing  Post-op Assessment: Report given to RN and Post -op Vital signs reviewed and stable  Post vital signs: Reviewed and stable  Last Vitals:  Vitals Value Taken Time  BP    Temp    Pulse    Resp    SpO2      Last Pain:  Vitals:   01/21/21 0947  TempSrc:   PainSc: 0-No pain      Patients Stated Pain Goal: 5 (28/76/81 1572)  Complications: No notable events documented.

## 2021-01-21 NOTE — Op Note (Signed)
[facility] Patient Name: Kathy Bennett Procedure Date: 01/21/2021 9:38 AM MRN: [id number] Date of Birth: 01-24-1956 Attending MD: Elon Alas. Abbey Chatters DO CSN: [id number] Age: 65 Admit Type: Outpatient Procedure:                Colonoscopy Indications:              High risk colon cancer surveillance: Personal                            history of colonic polyps Providers:                Elon Alas. Abbey Chatters, DO, Charlsie Quest. Insurance claims handler, Therapist, sports,                            Suzan Garibaldi. Risa Grill, Technician Referring MD:              Medicines:                See the Anesthesia note for documentation of the                            administered medications Complications:            No immediate complications. Estimated Blood Loss:     Estimated blood loss was minimal. Procedure:                Pre-Anesthesia Assessment:                           - The anesthesia plan was to use monitored                            anesthesia care (MAC).                           After obtaining informed consent, the colonoscope                            was passed under direct vision. Throughout the                            procedure, the patient's blood pressure, pulse, and                            oxygen saturations were monitored continuously. The                            [id number]-HQ190L ([id number]) scope was introduced through                            the anus and advanced to the the cecum, identified                            by appendiceal orifice and ileocecal valve. The                            colonoscopy was performed  without difficulty. The                            patient tolerated the procedure well. The quality                            of the bowel preparation was evaluated using the                            BBPS [facility] Bowel Preparation Scale) with scores                            of: Right Colon = 2 (minor amount of residual                            staining, small  fragments of stool and/or opaque                            liquid, but mucosa seen well), Transverse Colon = 2                            (minor amount of residual staining, small fragments                            of stool and/or opaque liquid, but mucosa seen                            well) and Left Colon = 3 (entire mucosa seen well                            with no residual staining, small fragments of stool                            or opaque liquid). The total BBPS score equals 7.                            The quality of the bowel preparation was fair. Scope In: 9:48:45 AM Scope Out: 10:17:33 AM Scope Withdrawal Time: 0 hours 22 minutes 8 seconds  Total Procedure Duration: 0 hours 28 minutes 48 seconds  Findings:      The perianal and digital rectal examinations were normal.      Localized moderate inflammation characterized by congestion (edema),       erosions, erythema and shallow ulcerations was found in the rectum.       Biopsies were taken with a cold forceps for histology.      Multiple small-mouthed diverticula were found in the sigmoid colon.      The exam was otherwise without abnormality. Impression:               - Preparation of the colon was fair.                           - Localized moderate inflammation was found in the  rectum secondary to proctitis. Biopsied.                           - Diverticulosis in the sigmoid colon.                           - The examination was otherwise normal. Moderate Sedation:      Per Anesthesia Care Recommendation:           - Patient has a contact number available for                            emergencies. The signs and symptoms of potential                            delayed complications were discussed with the                            patient. Return to normal activities tomorrow.                            Written discharge instructions were provided to the                             patient.                           - Resume previous diet.                           - Continue present medications.                           - Await pathology results.                           - Repeat colonoscopy date to be determined after                            pending pathology results are reviewed for                            surveillance based on pathology results.                           - Return to GI clinic in 3 months.                           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                            WIll start on bowel regimen. Procedure Code(s):        --- Professional ---  45380, Colonoscopy, flexible; with biopsy, single                            or multiple Diagnosis Code(s):        --- Professional ---                           Z86.010, Personal history of colonic polyps                           K62.89, Other specified diseases of anus and rectum                           K57.30, Diverticulosis of large intestine without                            perforation or abscess without bleeding CPT copyright 2019 American Medical Association. All rights reserved. The codes documented in this report are preliminary and upon coder review may  be revised to meet current compliance requirements. Elon Alas. Abbey Chatters, DO [place] Abbey Chatters, DO 01/21/2021 10:23:59 AM This report has been signed electronically. Number of Addenda: 0

## 2021-01-21 NOTE — H&P (Signed)
Primary Care Physician:  Glenda Chroman, MD Primary Gastroenterologist:  Dr. Abbey Chatters  Pre-Procedure History & Physical: HPI:  Kathy Bennett is a 65 y.o. female is here for a colonoscopy to be performed for surveillance purposes. Last TCS 12/31/2014 done by Dr. Juanita Craver, tubular adenoma, benign rectal mucosa with ischemic pattern injury, no evidence of chronic colitis or granulomatous inflammation  History reviewed. No pertinent past medical history.  Past Surgical History:  Procedure Laterality Date   COLONOSCOPY     EYE SURGERY      Prior to Admission medications   Medication Sig Start Date End Date Taking? Authorizing Provider  CALCIUM-VITAMIN D PO Take 1 tablet by mouth daily at 6 (six) AM.   Yes [provider]    Allergies as of 12/24/2020 - Review Complete 12/16/2020  Allergen Reaction Noted   Penicillins      History reviewed. No pertinent family history.  Social History   Socioeconomic History   Marital status: Married    Spouse name: Not on file   Number of children: Not on file   Years of education: Not on file   Highest education level: Not on file  Occupational History   Not on file  Tobacco Use   Smoking status: Never   Smokeless tobacco: Never  Vaping Use   Vaping Use: Never used  Substance and Sexual Activity   Alcohol use: Yes    Comment: occasional   Drug use: Never   Sexual activity: Not on file  Other Topics Concern   Not on file  Social History Narrative   Not on file   Social Determinants of Health   Financial Resource Strain: Not on file  Food Insecurity: Not on file  Transportation Needs: Not on file  Physical Activity: Not on file  Stress: Not on file  Social Connections: Not on file  Intimate Partner Violence: Not on file    Review of Systems: See HPI, otherwise negative ROS  Physical Exam: Vital signs in last 24 hours: Temp:  [98.4 F (36.9 C)] 98.4 F (36.9 C) (12/13 0834) Resp:  [16] 16 (12/13 0834) BP:  (109)/(63) 109/63 (12/13 0834) SpO2:  [100 %] 100 % (12/13 0834) Weight:  [55.8 kg] 55.8 kg (12/13 0834)   General:   Alert,  Well-developed, well-nourished, pleasant and cooperative in NAD Head:  Normocephalic and atraumatic. Eyes:  Sclera clear, no icterus.   Conjunctiva pink. Ears:  Normal auditory acuity. Nose:  No deformity, discharge,  or lesions. Mouth:  No deformity or lesions, dentition normal. Neck:  Supple; no masses or thyromegaly. Lungs:  Clear throughout to auscultation.   No wheezes, crackles, or rhonchi. No acute distress. Heart:  Regular rate and rhythm; no murmurs, clicks, rubs,  or gallops. Abdomen:  Soft, nontender and nondistended. No masses, hepatosplenomegaly or hernias noted. Normal bowel sounds, without guarding, and without rebound.   Msk:  Symmetrical without gross deformities. Normal posture. Extremities:  Without clubbing or edema. Neurologic:  Alert and  oriented x4;  grossly normal neurologically. Skin:  Intact without significant lesions or rashes. Cervical Nodes:  No significant cervical adenopathy. Psych:  Alert and cooperative. Normal mood and affect.  Impression/Plan: Kathy Bennett is here for a colonoscopy to be performed for surveillance purposes. Last TCS 12/31/2014 done by Dr. Juanita Craver, tubular adenoma, benign rectal mucosa with ischemic pattern injury, no evidence of chronic colitis or granulomatous inflammation  The risks of the procedure including infection, bleed, or perforation as well as benefits,  limitations, alternatives and imponderables have been reviewed with the patient. Questions have been answered. All parties agreeable.

## 2021-01-22 LAB — SURGICAL PATHOLOGY

## 2021-01-24 ENCOUNTER — Encounter (HOSPITAL_COMMUNITY): Payer: Self-pay | Admitting: Internal Medicine

## 2021-04-15 DIAGNOSIS — S134XXA Sprain of ligaments of cervical spine, initial encounter: Secondary | ICD-10-CM | POA: Diagnosis not present

## 2021-04-15 DIAGNOSIS — S233XXA Sprain of ligaments of thoracic spine, initial encounter: Secondary | ICD-10-CM | POA: Diagnosis not present

## 2021-04-15 DIAGNOSIS — S338XXA Sprain of other parts of lumbar spine and pelvis, initial encounter: Secondary | ICD-10-CM | POA: Diagnosis not present

## 2021-04-15 DIAGNOSIS — M9902 Segmental and somatic dysfunction of thoracic region: Secondary | ICD-10-CM | POA: Diagnosis not present

## 2021-04-15 DIAGNOSIS — M9903 Segmental and somatic dysfunction of lumbar region: Secondary | ICD-10-CM | POA: Diagnosis not present

## 2021-04-15 DIAGNOSIS — M9901 Segmental and somatic dysfunction of cervical region: Secondary | ICD-10-CM | POA: Diagnosis not present

## 2021-04-17 DIAGNOSIS — D485 Neoplasm of uncertain behavior of skin: Secondary | ICD-10-CM | POA: Diagnosis not present

## 2021-04-17 DIAGNOSIS — D2271 Melanocytic nevi of right lower limb, including hip: Secondary | ICD-10-CM | POA: Diagnosis not present

## 2021-04-17 DIAGNOSIS — D225 Melanocytic nevi of trunk: Secondary | ICD-10-CM | POA: Diagnosis not present

## 2021-04-17 DIAGNOSIS — Z1283 Encounter for screening for malignant neoplasm of skin: Secondary | ICD-10-CM | POA: Diagnosis not present

## 2021-04-17 DIAGNOSIS — X32XXXD Exposure to sunlight, subsequent encounter: Secondary | ICD-10-CM | POA: Diagnosis not present

## 2021-04-17 DIAGNOSIS — C44311 Basal cell carcinoma of skin of nose: Secondary | ICD-10-CM | POA: Diagnosis not present

## 2021-04-17 DIAGNOSIS — L570 Actinic keratosis: Secondary | ICD-10-CM | POA: Diagnosis not present

## 2021-04-21 DIAGNOSIS — M9901 Segmental and somatic dysfunction of cervical region: Secondary | ICD-10-CM | POA: Diagnosis not present

## 2021-04-21 DIAGNOSIS — M9903 Segmental and somatic dysfunction of lumbar region: Secondary | ICD-10-CM | POA: Diagnosis not present

## 2021-04-21 DIAGNOSIS — M9902 Segmental and somatic dysfunction of thoracic region: Secondary | ICD-10-CM | POA: Diagnosis not present

## 2021-04-21 DIAGNOSIS — S134XXA Sprain of ligaments of cervical spine, initial encounter: Secondary | ICD-10-CM | POA: Diagnosis not present

## 2021-04-21 DIAGNOSIS — S233XXA Sprain of ligaments of thoracic spine, initial encounter: Secondary | ICD-10-CM | POA: Diagnosis not present

## 2021-04-21 DIAGNOSIS — S338XXA Sprain of other parts of lumbar spine and pelvis, initial encounter: Secondary | ICD-10-CM | POA: Diagnosis not present

## 2021-04-22 ENCOUNTER — Encounter: Payer: Self-pay | Admitting: Internal Medicine

## 2021-04-24 DIAGNOSIS — M9902 Segmental and somatic dysfunction of thoracic region: Secondary | ICD-10-CM | POA: Diagnosis not present

## 2021-04-24 DIAGNOSIS — S338XXA Sprain of other parts of lumbar spine and pelvis, initial encounter: Secondary | ICD-10-CM | POA: Diagnosis not present

## 2021-04-24 DIAGNOSIS — M9903 Segmental and somatic dysfunction of lumbar region: Secondary | ICD-10-CM | POA: Diagnosis not present

## 2021-04-24 DIAGNOSIS — S233XXA Sprain of ligaments of thoracic spine, initial encounter: Secondary | ICD-10-CM | POA: Diagnosis not present

## 2021-04-24 DIAGNOSIS — S134XXA Sprain of ligaments of cervical spine, initial encounter: Secondary | ICD-10-CM | POA: Diagnosis not present

## 2021-04-24 DIAGNOSIS — M9901 Segmental and somatic dysfunction of cervical region: Secondary | ICD-10-CM | POA: Diagnosis not present

## 2021-04-28 DIAGNOSIS — S338XXA Sprain of other parts of lumbar spine and pelvis, initial encounter: Secondary | ICD-10-CM | POA: Diagnosis not present

## 2021-04-28 DIAGNOSIS — S134XXA Sprain of ligaments of cervical spine, initial encounter: Secondary | ICD-10-CM | POA: Diagnosis not present

## 2021-04-28 DIAGNOSIS — M9902 Segmental and somatic dysfunction of thoracic region: Secondary | ICD-10-CM | POA: Diagnosis not present

## 2021-04-28 DIAGNOSIS — M9901 Segmental and somatic dysfunction of cervical region: Secondary | ICD-10-CM | POA: Diagnosis not present

## 2021-04-28 DIAGNOSIS — S233XXA Sprain of ligaments of thoracic spine, initial encounter: Secondary | ICD-10-CM | POA: Diagnosis not present

## 2021-04-28 DIAGNOSIS — M9903 Segmental and somatic dysfunction of lumbar region: Secondary | ICD-10-CM | POA: Diagnosis not present

## 2021-05-06 DIAGNOSIS — M9903 Segmental and somatic dysfunction of lumbar region: Secondary | ICD-10-CM | POA: Diagnosis not present

## 2021-05-06 DIAGNOSIS — S134XXA Sprain of ligaments of cervical spine, initial encounter: Secondary | ICD-10-CM | POA: Diagnosis not present

## 2021-05-06 DIAGNOSIS — M9902 Segmental and somatic dysfunction of thoracic region: Secondary | ICD-10-CM | POA: Diagnosis not present

## 2021-05-06 DIAGNOSIS — M9901 Segmental and somatic dysfunction of cervical region: Secondary | ICD-10-CM | POA: Diagnosis not present

## 2021-05-06 DIAGNOSIS — S338XXA Sprain of other parts of lumbar spine and pelvis, initial encounter: Secondary | ICD-10-CM | POA: Diagnosis not present

## 2021-05-06 DIAGNOSIS — S233XXA Sprain of ligaments of thoracic spine, initial encounter: Secondary | ICD-10-CM | POA: Diagnosis not present

## 2021-05-20 DIAGNOSIS — M9901 Segmental and somatic dysfunction of cervical region: Secondary | ICD-10-CM | POA: Diagnosis not present

## 2021-05-20 DIAGNOSIS — M9903 Segmental and somatic dysfunction of lumbar region: Secondary | ICD-10-CM | POA: Diagnosis not present

## 2021-05-20 DIAGNOSIS — S233XXA Sprain of ligaments of thoracic spine, initial encounter: Secondary | ICD-10-CM | POA: Diagnosis not present

## 2021-05-20 DIAGNOSIS — S134XXA Sprain of ligaments of cervical spine, initial encounter: Secondary | ICD-10-CM | POA: Diagnosis not present

## 2021-05-20 DIAGNOSIS — S338XXA Sprain of other parts of lumbar spine and pelvis, initial encounter: Secondary | ICD-10-CM | POA: Diagnosis not present

## 2021-05-20 DIAGNOSIS — M9902 Segmental and somatic dysfunction of thoracic region: Secondary | ICD-10-CM | POA: Diagnosis not present

## 2021-05-26 DIAGNOSIS — H5210 Myopia, unspecified eye: Secondary | ICD-10-CM | POA: Diagnosis not present

## 2021-05-28 DIAGNOSIS — C44311 Basal cell carcinoma of skin of nose: Secondary | ICD-10-CM | POA: Diagnosis not present

## 2021-05-28 DIAGNOSIS — D485 Neoplasm of uncertain behavior of skin: Secondary | ICD-10-CM | POA: Diagnosis not present

## 2021-05-28 DIAGNOSIS — D225 Melanocytic nevi of trunk: Secondary | ICD-10-CM | POA: Diagnosis not present

## 2021-05-29 ENCOUNTER — Ambulatory Visit: Payer: Medicare HMO | Admitting: Internal Medicine

## 2021-05-29 VITALS — BP 120/80 | HR 72 | Temp 97.3°F | Ht 60.0 in | Wt 122.4 lb

## 2021-05-29 DIAGNOSIS — K623 Rectal prolapse: Secondary | ICD-10-CM

## 2021-05-29 DIAGNOSIS — K6289 Other specified diseases of anus and rectum: Secondary | ICD-10-CM | POA: Diagnosis not present

## 2021-05-29 DIAGNOSIS — K5900 Constipation, unspecified: Secondary | ICD-10-CM | POA: Diagnosis not present

## 2021-05-29 NOTE — Progress Notes (Signed)
? ? ?Referring Provider: Glenda Chroman, MD ?Primary Care Physician:  Glenda Chroman, MD ?Primary GI:  Dr. Abbey Chatters ? ?Chief Complaint  ?Patient presents with  ? Follow-up  ? ? ?HPI:   ?Kathy Bennett is a 66 y.o. female who presents to the clinic today for follow-up visit.  Underwent colonoscopy December 2022 for surveillance purposes/history of polyps.  No polyps or cancer identified though she did have evidence of proctitis.  Biopsies consistent with acute inflammation possible ischemia versus rectal prolapse.  Started her on MiraLAX after this and states her bowels are moving better.  She does complain that she feels part of her rectum protrudes during bowel movements and eventually retracts.  Sometimes she has to help this process.  No melena hematochezia.  No mucus in her stool.  No rectal pain or discomfort. ? ?No past medical history on file. ? ?Past Surgical History:  ?Procedure Laterality Date  ? BIOPSY  01/21/2021  ? Procedure: BIOPSY;  Surgeon: Eloise Harman, DO;  [facility];  Service: Endoscopy;;  ? COLONOSCOPY    ? COLONOSCOPY WITH PROPOFOL N/A 01/21/2021  ? Procedure: COLONOSCOPY WITH PROPOFOL;  Surgeon: Eloise Harman, DO;  [facility];  Service: Endoscopy;  Laterality: N/A;  9:45 / ASA I/ II  ? EYE SURGERY    ? ? ?Current Outpatient Medications  ?Medication Sig Dispense Refill  ? CALCIUM-VITAMIN D PO Take 1 tablet by mouth daily at 6 (six) AM.    ? polyethylene glycol (MIRALAX / GLYCOLAX) 17 g packet Take 17 g by mouth as needed.    ? vitamin B-12 (CYANOCOBALAMIN) 50 MCG tablet Take 50 mcg by mouth daily.    ? ?No current facility-administered medications for this visit.  ? ? ?Allergies as of 05/29/2021 - Review Complete 05/29/2021  ?Allergen Reaction Noted  ? Penicillins Hives   ? Azithromycin Hives 01/15/2021  ? ? ?No family history on file. ? ?Social History  ? ?Socioeconomic History  ? Marital status: Married  ?  Spouse name: Not on file  ? Number of children: Not  on file  ? Years of education: Not on file  ? Highest education level: Not on file  ?Occupational History  ? Not on file  ?Tobacco Use  ? Smoking status: Never  ? Smokeless tobacco: Never  ?Vaping Use  ? Vaping Use: Never used  ?Substance and Sexual Activity  ? Alcohol use: Yes  ?  Comment: occasional  ? Drug use: Never  ? Sexual activity: Not on file  ?Other Topics Concern  ? Not on file  ?Social History Narrative  ? Not on file  ? ?Social Determinants of Health  ? ?Financial Resource Strain: Not on file  ?Food Insecurity: Not on file  ?Transportation Needs: Not on file  ?Physical Activity: Not on file  ?Stress: Not on file  ?Social Connections: Not on file  ? ? ?Subjective: ?Review of Systems  ?Constitutional:  Negative for chills and fever.  ?HENT:  Negative for congestion and hearing loss.   ?Eyes:  Negative for blurred vision and double vision.  ?Respiratory:  Negative for cough and shortness of breath.   ?Cardiovascular:  Negative for chest pain and palpitations.  ?Gastrointestinal:  Positive for constipation. Negative for abdominal pain, blood in stool, diarrhea, heartburn, melena and vomiting.  ?     Straining  ?Genitourinary:  Negative for dysuria and urgency.  ?Musculoskeletal:  Negative for joint pain and myalgias.  ?Skin:  Negative for itching  and rash.  ?Neurological:  Negative for dizziness and headaches.  ?Psychiatric/Behavioral:  Negative for depression. The patient is not nervous/anxious.   ? ? ?Objective: ?BP 120/80   Pulse 72   Temp (!) 97.3 ?F (36.3 ?C)   Ht 5' (1.524 m)   Wt 122 lb 6.4 oz (55.5 kg)   BMI 23.90 kg/m?  ?Physical Exam ?Constitutional:   ?   Appearance: Normal appearance.  ?HENT:  ?   Head: Normocephalic and atraumatic.  ?Eyes:  ?   Extraocular Movements: Extraocular movements intact.  ?   Conjunctiva/sclera: Conjunctivae normal.  ?Cardiovascular:  ?   Rate and Rhythm: Normal rate and regular rhythm.  ?Pulmonary:  ?   Effort: Pulmonary effort is normal.  ?   Breath sounds:  Normal breath sounds.  ?Abdominal:  ?   General: Bowel sounds are normal.  ?   Palpations: Abdomen is soft.  ?Musculoskeletal:     ?   General: No swelling. Normal range of motion.  ?   Cervical back: Normal range of motion and neck supple.  ?Skin: ?   General: Skin is warm and dry.  ?   Coloration: Skin is not jaundiced.  ?Neurological:  ?   General: No focal deficit present.  ?   Mental Status: She is alert and oriented to person, place, and time.  ?Psychiatric:     ?   Mood and Affect: Mood normal.     ?   Behavior: Behavior normal.  ? ? ? 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 ?Plan: ?Patient states her bowels are moving better on MiraLAX.  Symptoms do seem consistent with rectal prolapse which would explain her rectal biopsies.  I printed out pelvic floor exercises for patient to perform at home.  Discussed surgical referral as well to discuss possible surgical correction of this.  She would like to hold off for now.  If she changes her mind she will call the office and we will set this up.  Otherwise follow-up in 6 months. ? ?05/29/2021 2:43 PM ? ? ?Disclaimer: This note was dictated with voice recognition software. Similar sounding words can inadvertently be transcribed and may not be corrected upon review. ? ?

## 2021-05-29 NOTE — Patient Instructions (Signed)
Happy to hear that you are doing well.  Your symptoms are consistent with rectal prolapse.  Continue on MiraLAX to reduce straining. ? ?Would recommend pelvic floor exercises.  I will print out information in regards to this today. ? ?If you decide you would like to meet with a surgeon to discuss surgical treatment of rectal prolapse then give [place] a call and let me know and I will send in referral. ? ?Otherwise follow-up in 6 months or sooner if needed. ? ?It was very nice seeing you again today. ? ?Dr. Abbey Chatters ? ?At [facility] we value your feedback. You may receive a survey about your visit today. Please share your experience as we strive to create trusting relationships with our patients to provide genuine, compassionate, quality care. ? ?We appreciate your understanding and patience as we review any laboratory studies, imaging, and other diagnostic tests that are ordered as we care for you. Our office policy is 5 business days for review of these results, and any emergent or urgent results are addressed in a timely manner for your best interest. If you do not hear from our office in 1 week, please contact us.  ? ?We also encourage the use of MyChart, which contains your medical information for your review as well. If you are not enrolled in this feature, an access code is on this after visit summary for your convenience. Thank you for allowing [place] to be involved in your care. ? ?It was great to see you today!  I hope you have a great rest of your Spring! ? ? ? ?Elon Alas. Abbey Chatters, D.O. ?Gastroenterology and Hepatology ?[facility] ? ?

## 2021-06-03 DIAGNOSIS — M9902 Segmental and somatic dysfunction of thoracic region: Secondary | ICD-10-CM | POA: Diagnosis not present

## 2021-06-03 DIAGNOSIS — S233XXA Sprain of ligaments of thoracic spine, initial encounter: Secondary | ICD-10-CM | POA: Diagnosis not present

## 2021-06-03 DIAGNOSIS — S134XXA Sprain of ligaments of cervical spine, initial encounter: Secondary | ICD-10-CM | POA: Diagnosis not present

## 2021-06-03 DIAGNOSIS — M9901 Segmental and somatic dysfunction of cervical region: Secondary | ICD-10-CM | POA: Diagnosis not present

## 2021-06-03 DIAGNOSIS — S338XXA Sprain of other parts of lumbar spine and pelvis, initial encounter: Secondary | ICD-10-CM | POA: Diagnosis not present

## 2021-06-03 DIAGNOSIS — M9903 Segmental and somatic dysfunction of lumbar region: Secondary | ICD-10-CM | POA: Diagnosis not present

## 2021-06-17 DIAGNOSIS — M9902 Segmental and somatic dysfunction of thoracic region: Secondary | ICD-10-CM | POA: Diagnosis not present

## 2021-06-17 DIAGNOSIS — M9903 Segmental and somatic dysfunction of lumbar region: Secondary | ICD-10-CM | POA: Diagnosis not present

## 2021-06-17 DIAGNOSIS — S338XXA Sprain of other parts of lumbar spine and pelvis, initial encounter: Secondary | ICD-10-CM | POA: Diagnosis not present

## 2021-06-17 DIAGNOSIS — S134XXA Sprain of ligaments of cervical spine, initial encounter: Secondary | ICD-10-CM | POA: Diagnosis not present

## 2021-06-17 DIAGNOSIS — S233XXA Sprain of ligaments of thoracic spine, initial encounter: Secondary | ICD-10-CM | POA: Diagnosis not present

## 2021-06-17 DIAGNOSIS — M9901 Segmental and somatic dysfunction of cervical region: Secondary | ICD-10-CM | POA: Diagnosis not present

## 2021-07-02 DIAGNOSIS — C44311 Basal cell carcinoma of skin of nose: Secondary | ICD-10-CM | POA: Diagnosis not present

## 2021-07-03 DIAGNOSIS — S338XXA Sprain of other parts of lumbar spine and pelvis, initial encounter: Secondary | ICD-10-CM | POA: Diagnosis not present

## 2021-07-03 DIAGNOSIS — S134XXA Sprain of ligaments of cervical spine, initial encounter: Secondary | ICD-10-CM | POA: Diagnosis not present

## 2021-07-03 DIAGNOSIS — S233XXA Sprain of ligaments of thoracic spine, initial encounter: Secondary | ICD-10-CM | POA: Diagnosis not present

## 2021-07-03 DIAGNOSIS — M9902 Segmental and somatic dysfunction of thoracic region: Secondary | ICD-10-CM | POA: Diagnosis not present

## 2021-07-03 DIAGNOSIS — M9901 Segmental and somatic dysfunction of cervical region: Secondary | ICD-10-CM | POA: Diagnosis not present

## 2021-07-03 DIAGNOSIS — M9903 Segmental and somatic dysfunction of lumbar region: Secondary | ICD-10-CM | POA: Diagnosis not present

## 2021-07-30 DIAGNOSIS — S134XXA Sprain of ligaments of cervical spine, initial encounter: Secondary | ICD-10-CM | POA: Diagnosis not present

## 2021-07-30 DIAGNOSIS — M9902 Segmental and somatic dysfunction of thoracic region: Secondary | ICD-10-CM | POA: Diagnosis not present

## 2021-07-30 DIAGNOSIS — M9903 Segmental and somatic dysfunction of lumbar region: Secondary | ICD-10-CM | POA: Diagnosis not present

## 2021-07-30 DIAGNOSIS — M9901 Segmental and somatic dysfunction of cervical region: Secondary | ICD-10-CM | POA: Diagnosis not present

## 2021-07-30 DIAGNOSIS — S233XXA Sprain of ligaments of thoracic spine, initial encounter: Secondary | ICD-10-CM | POA: Diagnosis not present

## 2021-07-30 DIAGNOSIS — S338XXA Sprain of other parts of lumbar spine and pelvis, initial encounter: Secondary | ICD-10-CM | POA: Diagnosis not present

## 2021-08-20 DIAGNOSIS — S338XXA Sprain of other parts of lumbar spine and pelvis, initial encounter: Secondary | ICD-10-CM | POA: Diagnosis not present

## 2021-08-20 DIAGNOSIS — M9902 Segmental and somatic dysfunction of thoracic region: Secondary | ICD-10-CM | POA: Diagnosis not present

## 2021-08-20 DIAGNOSIS — M9901 Segmental and somatic dysfunction of cervical region: Secondary | ICD-10-CM | POA: Diagnosis not present

## 2021-08-20 DIAGNOSIS — S233XXA Sprain of ligaments of thoracic spine, initial encounter: Secondary | ICD-10-CM | POA: Diagnosis not present

## 2021-08-20 DIAGNOSIS — M9903 Segmental and somatic dysfunction of lumbar region: Secondary | ICD-10-CM | POA: Diagnosis not present

## 2021-08-20 DIAGNOSIS — S134XXA Sprain of ligaments of cervical spine, initial encounter: Secondary | ICD-10-CM | POA: Diagnosis not present

## 2021-09-01 DIAGNOSIS — Z0100 Encounter for examination of eyes and vision without abnormal findings: Secondary | ICD-10-CM | POA: Diagnosis not present

## 2021-09-01 DIAGNOSIS — H40013 Open angle with borderline findings, low risk, bilateral: Secondary | ICD-10-CM | POA: Diagnosis not present

## 2021-09-10 DIAGNOSIS — C44311 Basal cell carcinoma of skin of nose: Secondary | ICD-10-CM | POA: Diagnosis not present

## 2021-09-23 DIAGNOSIS — S233XXA Sprain of ligaments of thoracic spine, initial encounter: Secondary | ICD-10-CM | POA: Diagnosis not present

## 2021-09-23 DIAGNOSIS — M9903 Segmental and somatic dysfunction of lumbar region: Secondary | ICD-10-CM | POA: Diagnosis not present

## 2021-09-23 DIAGNOSIS — S338XXA Sprain of other parts of lumbar spine and pelvis, initial encounter: Secondary | ICD-10-CM | POA: Diagnosis not present

## 2021-09-23 DIAGNOSIS — M9902 Segmental and somatic dysfunction of thoracic region: Secondary | ICD-10-CM | POA: Diagnosis not present

## 2021-09-23 DIAGNOSIS — M9901 Segmental and somatic dysfunction of cervical region: Secondary | ICD-10-CM | POA: Diagnosis not present

## 2021-09-23 DIAGNOSIS — S134XXA Sprain of ligaments of cervical spine, initial encounter: Secondary | ICD-10-CM | POA: Diagnosis not present

## 2021-09-29 DIAGNOSIS — Z833 Family history of diabetes mellitus: Secondary | ICD-10-CM | POA: Diagnosis not present

## 2021-09-29 DIAGNOSIS — Z880 Allergy status to penicillin: Secondary | ICD-10-CM | POA: Diagnosis not present

## 2021-09-29 DIAGNOSIS — Z85828 Personal history of other malignant neoplasm of skin: Secondary | ICD-10-CM | POA: Diagnosis not present

## 2021-09-29 DIAGNOSIS — Z8249 Family history of ischemic heart disease and other diseases of the circulatory system: Secondary | ICD-10-CM | POA: Diagnosis not present

## 2021-09-29 DIAGNOSIS — R32 Unspecified urinary incontinence: Secondary | ICD-10-CM | POA: Diagnosis not present

## 2021-09-29 DIAGNOSIS — K5900 Constipation, unspecified: Secondary | ICD-10-CM | POA: Diagnosis not present

## 2021-10-29 ENCOUNTER — Encounter: Payer: Self-pay | Admitting: *Deleted

## 2021-10-30 DIAGNOSIS — M9903 Segmental and somatic dysfunction of lumbar region: Secondary | ICD-10-CM | POA: Diagnosis not present

## 2021-10-30 DIAGNOSIS — S233XXA Sprain of ligaments of thoracic spine, initial encounter: Secondary | ICD-10-CM | POA: Diagnosis not present

## 2021-10-30 DIAGNOSIS — M9901 Segmental and somatic dysfunction of cervical region: Secondary | ICD-10-CM | POA: Diagnosis not present

## 2021-10-30 DIAGNOSIS — S134XXA Sprain of ligaments of cervical spine, initial encounter: Secondary | ICD-10-CM | POA: Diagnosis not present

## 2021-10-30 DIAGNOSIS — M9902 Segmental and somatic dysfunction of thoracic region: Secondary | ICD-10-CM | POA: Diagnosis not present

## 2021-10-30 DIAGNOSIS — S338XXA Sprain of other parts of lumbar spine and pelvis, initial encounter: Secondary | ICD-10-CM | POA: Diagnosis not present

## 2021-11-05 DIAGNOSIS — E539 Vitamin B deficiency, unspecified: Secondary | ICD-10-CM | POA: Diagnosis not present

## 2021-11-05 DIAGNOSIS — Z299 Encounter for prophylactic measures, unspecified: Secondary | ICD-10-CM | POA: Diagnosis not present

## 2021-11-05 DIAGNOSIS — Z79899 Other long term (current) drug therapy: Secondary | ICD-10-CM | POA: Diagnosis not present

## 2021-11-05 DIAGNOSIS — Z23 Encounter for immunization: Secondary | ICD-10-CM | POA: Diagnosis not present

## 2021-11-05 DIAGNOSIS — Z7189 Other specified counseling: Secondary | ICD-10-CM | POA: Diagnosis not present

## 2021-11-05 DIAGNOSIS — Z1339 Encounter for screening examination for other mental health and behavioral disorders: Secondary | ICD-10-CM | POA: Diagnosis not present

## 2021-11-05 DIAGNOSIS — Z789 Other specified health status: Secondary | ICD-10-CM | POA: Diagnosis not present

## 2021-11-05 DIAGNOSIS — E7800 Pure hypercholesterolemia, unspecified: Secondary | ICD-10-CM | POA: Diagnosis not present

## 2021-11-05 DIAGNOSIS — R5383 Other fatigue: Secondary | ICD-10-CM | POA: Diagnosis not present

## 2021-11-05 DIAGNOSIS — Z1331 Encounter for screening for depression: Secondary | ICD-10-CM | POA: Diagnosis not present

## 2021-11-05 DIAGNOSIS — Z6824 Body mass index (BMI) 24.0-24.9, adult: Secondary | ICD-10-CM | POA: Diagnosis not present

## 2021-11-05 DIAGNOSIS — Z0000 Encounter for general adult medical examination without abnormal findings: Secondary | ICD-10-CM | POA: Diagnosis not present

## 2021-12-01 DIAGNOSIS — Z08 Encounter for follow-up examination after completed treatment for malignant neoplasm: Secondary | ICD-10-CM | POA: Diagnosis not present

## 2021-12-01 DIAGNOSIS — L905 Scar conditions and fibrosis of skin: Secondary | ICD-10-CM | POA: Diagnosis not present

## 2021-12-01 DIAGNOSIS — L570 Actinic keratosis: Secondary | ICD-10-CM | POA: Diagnosis not present

## 2021-12-01 DIAGNOSIS — Z85828 Personal history of other malignant neoplasm of skin: Secondary | ICD-10-CM | POA: Diagnosis not present

## 2021-12-01 DIAGNOSIS — X32XXXD Exposure to sunlight, subsequent encounter: Secondary | ICD-10-CM | POA: Diagnosis not present

## 2021-12-03 ENCOUNTER — Other Ambulatory Visit (HOSPITAL_COMMUNITY): Payer: Self-pay | Admitting: Internal Medicine

## 2021-12-03 DIAGNOSIS — Z1231 Encounter for screening mammogram for malignant neoplasm of breast: Secondary | ICD-10-CM

## 2021-12-04 DIAGNOSIS — S233XXA Sprain of ligaments of thoracic spine, initial encounter: Secondary | ICD-10-CM | POA: Diagnosis not present

## 2021-12-04 DIAGNOSIS — M9901 Segmental and somatic dysfunction of cervical region: Secondary | ICD-10-CM | POA: Diagnosis not present

## 2021-12-04 DIAGNOSIS — M9902 Segmental and somatic dysfunction of thoracic region: Secondary | ICD-10-CM | POA: Diagnosis not present

## 2021-12-04 DIAGNOSIS — M9903 Segmental and somatic dysfunction of lumbar region: Secondary | ICD-10-CM | POA: Diagnosis not present

## 2021-12-04 DIAGNOSIS — S134XXA Sprain of ligaments of cervical spine, initial encounter: Secondary | ICD-10-CM | POA: Diagnosis not present

## 2021-12-04 DIAGNOSIS — S338XXA Sprain of other parts of lumbar spine and pelvis, initial encounter: Secondary | ICD-10-CM | POA: Diagnosis not present

## 2021-12-29 ENCOUNTER — Ambulatory Visit (HOSPITAL_COMMUNITY)
Admission: RE | Admit: 2021-12-29 | Discharge: 2021-12-29 | Disposition: A | Discharge status: Home / Self Care | Payer: Medicare HMO | Source: Ambulatory Visit | Attending: Internal Medicine | Admitting: Internal Medicine

## 2021-12-29 DIAGNOSIS — Z1231 Encounter for screening mammogram for malignant neoplasm of breast: Secondary | ICD-10-CM | POA: Diagnosis not present

## 2021-12-30 DIAGNOSIS — E2839 Other primary ovarian failure: Secondary | ICD-10-CM | POA: Diagnosis not present

## 2021-12-30 DIAGNOSIS — Z79899 Other long term (current) drug therapy: Secondary | ICD-10-CM | POA: Diagnosis not present

## 2021-12-30 DIAGNOSIS — M859 Disorder of bone density and structure, unspecified: Secondary | ICD-10-CM | POA: Diagnosis not present

## 2022-01-12 DIAGNOSIS — S134XXA Sprain of ligaments of cervical spine, initial encounter: Secondary | ICD-10-CM | POA: Diagnosis not present

## 2022-01-12 DIAGNOSIS — M9903 Segmental and somatic dysfunction of lumbar region: Secondary | ICD-10-CM | POA: Diagnosis not present

## 2022-01-12 DIAGNOSIS — M9901 Segmental and somatic dysfunction of cervical region: Secondary | ICD-10-CM | POA: Diagnosis not present

## 2022-01-12 DIAGNOSIS — S233XXA Sprain of ligaments of thoracic spine, initial encounter: Secondary | ICD-10-CM | POA: Diagnosis not present

## 2022-01-12 DIAGNOSIS — S338XXA Sprain of other parts of lumbar spine and pelvis, initial encounter: Secondary | ICD-10-CM | POA: Diagnosis not present

## 2022-01-12 DIAGNOSIS — M9902 Segmental and somatic dysfunction of thoracic region: Secondary | ICD-10-CM | POA: Diagnosis not present

## 2022-01-14 ENCOUNTER — Ambulatory Visit (INDEPENDENT_AMBULATORY_CARE_PROVIDER_SITE_OTHER): Payer: Medicare HMO | Admitting: Internal Medicine

## 2022-01-14 ENCOUNTER — Encounter: Payer: Self-pay | Admitting: Internal Medicine

## 2022-01-14 VITALS — BP 109/72 | HR 70 | Temp 97.2°F | Ht 60.0 in | Wt 122.4 lb

## 2022-01-14 DIAGNOSIS — K623 Rectal prolapse: Secondary | ICD-10-CM

## 2022-01-14 DIAGNOSIS — K5904 Chronic idiopathic constipation: Secondary | ICD-10-CM | POA: Diagnosis not present

## 2022-01-14 DIAGNOSIS — K6289 Other specified diseases of anus and rectum: Secondary | ICD-10-CM

## 2022-01-14 NOTE — Patient Instructions (Signed)
Happy to hear that you are doing well.  Continue MiraLAX as needed for your chronic constipation.  Would recommend switching Metamucil to Benefiber and see if this works better.  Can take this 1-3 times a day.  Be sure to drink at least 6 glasses of water daily  Follow-up in 1 year or sooner if needed.  Will be of a great holiday season.  Congratulations on the upcoming wedding of your son.  Dr. Abbey Chatters

## 2022-01-14 NOTE — Progress Notes (Signed)
Referring Provider: Glenda Chroman, MD Primary Care Physician:  Glenda Chroman, MD Primary GI:  Dr. Abbey Chatters  Chief Complaint  Patient presents with   rectal prolapse    Follow up on rectal prolapse. Takes 3/4 capful of miralax 1 -3 times per week. Gives her a lot of gas. Has BM every day to every other day.     HPI:   Kathy Bennett is a 66 y.o. female who presents to the clinic today for follow-up visit.  Underwent colonoscopy December 2022 for surveillance purposes/history of polyps.  No polyps or cancer identified though she did have evidence of proctitis.  Biopsies consistent with acute inflammation possible ischemia versus rectal prolapse.  Started her on MiraLAX after this and states her bowels are moving better.  She does complain that she feels part of her rectum protrudes during bowel movements and eventually retracts.  Sometimes she has to help this process.  No melena hematochezia.  No mucus in her stool.  No rectal pain or discomfort.  Past Medical History:  Diagnosis Date   Chronic constipation    Rectal prolapse     Past Surgical History:  Procedure Laterality Date   BIOPSY  01/21/2021   Procedure: BIOPSY;  Surgeon: Eloise Harman, DO;  [facility];  Service: Endoscopy;;   COLONOSCOPY     COLONOSCOPY WITH PROPOFOL N/A 01/21/2021   Procedure: COLONOSCOPY WITH PROPOFOL;  Surgeon: Eloise Harman, DO;  [facility];  Service: Endoscopy;  Laterality: N/A;  9:45 / ASA I/ II   EYE SURGERY      Current Outpatient Medications  Medication Sig Dispense Refill   CALCIUM-VITAMIN D PO Take 1 tablet by mouth daily at 6 (six) AM.     polyethylene glycol (MIRALAX / GLYCOLAX) 17 g packet Take 17 g by mouth as needed.     vitamin B-12 (CYANOCOBALAMIN) 50 MCG tablet Take 50 mcg by mouth daily.     No current facility-administered medications for this visit.    Allergies as of 01/14/2022 - Review Complete 01/14/2022  Allergen Reaction Noted    Penicillins Hives    Azithromycin Hives 01/15/2021    History reviewed. No pertinent family history.  Social History   Socioeconomic History   Marital status: Married    Spouse name: Not on file   Number of children: Not on file   Years of education: Not on file   Highest education level: Not on file  Occupational History   Not on file  Tobacco Use   Smoking status: Never    Passive exposure: Past   Smokeless tobacco: Never  Vaping Use   Vaping Use: Never used  Substance and Sexual Activity   Alcohol use: Yes    Comment: occasional   Drug use: Never   Sexual activity: Not on file  Other Topics Concern   Not on file  Social History Narrative   Not on file   Social Determinants of Health   Financial Resource Strain: Not on file  Food Insecurity: Not on file  Transportation Needs: Not on file  Physical Activity: Not on file  Stress: Not on file  Social Connections: Not on file    Subjective: Review of Systems  Constitutional:  Negative for chills and fever.  HENT:  Negative for congestion and hearing loss.   Eyes:  Negative for blurred vision and double vision.  Respiratory:  Negative for cough and shortness of breath.   Cardiovascular:  Negative  for chest pain and palpitations.  Gastrointestinal:  Positive for constipation. Negative for abdominal pain, blood in stool, diarrhea, heartburn, melena and vomiting.       Straining  Genitourinary:  Negative for dysuria and urgency.  Musculoskeletal:  Negative for joint pain and myalgias.  Skin:  Negative for itching and rash.  Neurological:  Negative for dizziness and headaches.  Psychiatric/Behavioral:  Negative for depression. The patient is not nervous/anxious.      Objective: BP 109/72 (BP Location: Left Arm, Patient Position: Sitting, Cuff Size: Normal)   Pulse 70   Temp (!) 97.2 F (36.2 C) (Oral)   Ht 5' (1.524 m)   Wt 122 lb 6.4 oz (55.5 kg)   BMI 23.90 kg/m  Physical Exam Constitutional:       Appearance: Normal appearance.  HENT:     Head: Normocephalic and atraumatic.  Eyes:     Extraocular Movements: Extraocular movements intact.     Conjunctiva/sclera: Conjunctivae normal.  Cardiovascular:     Rate and Rhythm: Normal rate and regular rhythm.  Pulmonary:     Effort: Pulmonary effort is normal.     Breath sounds: Normal breath sounds.  Abdominal:     General: Bowel sounds are normal.     Palpations: Abdomen is soft.  Musculoskeletal:        General: No swelling. Normal range of motion.     Cervical back: Normal range of motion and neck supple.  Skin:    General: Skin is warm and dry.     Coloration: Skin is not jaundiced.  Neurological:     General: No focal deficit present.     Mental Status: She is alert and oriented to person, place, and time.  Psychiatric:        Mood and Affect: Mood normal.        Behavior: Behavior normal.      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  Symptoms do seem consistent with rectal prolapse which would explain her rectal biopsies.  I printed out pelvic floor exercises for patient to perform at home.  Discussed surgical referral as well to discuss possible surgical correction of this.  She would like to hold off for now.  If she changes her mind she will call the office and we will set this up.   Recommended adding Benefiber 1-3x daily. Recommended she drink at least 6 glasses of water daily.   Follow up in 1 year or sooner if needed.   01/14/2022 10:33 AM   Disclaimer: This note was dictated with voice recognition software. Similar sounding words can inadvertently be transcribed and may not be corrected upon review.

## 2022-02-10 DIAGNOSIS — Z299 Encounter for prophylactic measures, unspecified: Secondary | ICD-10-CM | POA: Diagnosis not present

## 2022-02-10 DIAGNOSIS — Z789 Other specified health status: Secondary | ICD-10-CM | POA: Diagnosis not present

## 2022-02-10 DIAGNOSIS — N390 Urinary tract infection, site not specified: Secondary | ICD-10-CM | POA: Diagnosis not present

## 2022-02-10 DIAGNOSIS — M47812 Spondylosis without myelopathy or radiculopathy, cervical region: Secondary | ICD-10-CM | POA: Diagnosis not present

## 2022-02-10 DIAGNOSIS — M9901 Segmental and somatic dysfunction of cervical region: Secondary | ICD-10-CM | POA: Diagnosis not present

## 2022-02-10 DIAGNOSIS — R350 Frequency of micturition: Secondary | ICD-10-CM | POA: Diagnosis not present

## 2022-03-24 DIAGNOSIS — M9901 Segmental and somatic dysfunction of cervical region: Secondary | ICD-10-CM | POA: Diagnosis not present

## 2022-03-24 DIAGNOSIS — M47812 Spondylosis without myelopathy or radiculopathy, cervical region: Secondary | ICD-10-CM | POA: Diagnosis not present

## 2022-04-04 DIAGNOSIS — R69 Illness, unspecified: Secondary | ICD-10-CM | POA: Diagnosis not present

## 2022-04-28 DIAGNOSIS — M47812 Spondylosis without myelopathy or radiculopathy, cervical region: Secondary | ICD-10-CM | POA: Diagnosis not present

## 2022-04-28 DIAGNOSIS — M9901 Segmental and somatic dysfunction of cervical region: Secondary | ICD-10-CM | POA: Diagnosis not present

## 2022-05-20 DIAGNOSIS — N951 Menopausal and female climacteric states: Secondary | ICD-10-CM | POA: Diagnosis not present

## 2022-05-20 DIAGNOSIS — K623 Rectal prolapse: Secondary | ICD-10-CM | POA: Diagnosis not present

## 2022-05-20 DIAGNOSIS — Z01419 Encounter for gynecological examination (general) (routine) without abnormal findings: Secondary | ICD-10-CM | POA: Diagnosis not present

## 2022-05-27 DIAGNOSIS — B078 Other viral warts: Secondary | ICD-10-CM | POA: Diagnosis not present

## 2022-05-27 DIAGNOSIS — X32XXXD Exposure to sunlight, subsequent encounter: Secondary | ICD-10-CM | POA: Diagnosis not present

## 2022-05-27 DIAGNOSIS — Z85828 Personal history of other malignant neoplasm of skin: Secondary | ICD-10-CM | POA: Diagnosis not present

## 2022-05-27 DIAGNOSIS — Z08 Encounter for follow-up examination after completed treatment for malignant neoplasm: Secondary | ICD-10-CM | POA: Diagnosis not present

## 2022-05-27 DIAGNOSIS — Z1283 Encounter for screening for malignant neoplasm of skin: Secondary | ICD-10-CM | POA: Diagnosis not present

## 2022-05-27 DIAGNOSIS — D225 Melanocytic nevi of trunk: Secondary | ICD-10-CM | POA: Diagnosis not present

## 2022-05-27 DIAGNOSIS — L570 Actinic keratosis: Secondary | ICD-10-CM | POA: Diagnosis not present

## 2022-05-28 DIAGNOSIS — H5213 Myopia, bilateral: Secondary | ICD-10-CM | POA: Diagnosis not present

## 2022-06-02 DIAGNOSIS — H524 Presbyopia: Secondary | ICD-10-CM | POA: Diagnosis not present

## 2022-06-02 DIAGNOSIS — H52223 Regular astigmatism, bilateral: Secondary | ICD-10-CM | POA: Diagnosis not present

## 2022-06-16 DIAGNOSIS — M9901 Segmental and somatic dysfunction of cervical region: Secondary | ICD-10-CM | POA: Diagnosis not present

## 2022-06-16 DIAGNOSIS — M47812 Spondylosis without myelopathy or radiculopathy, cervical region: Secondary | ICD-10-CM | POA: Diagnosis not present

## 2022-07-23 DIAGNOSIS — M1990 Unspecified osteoarthritis, unspecified site: Secondary | ICD-10-CM | POA: Diagnosis not present

## 2022-07-23 DIAGNOSIS — M8580 Other specified disorders of bone density and structure, unspecified site: Secondary | ICD-10-CM | POA: Diagnosis not present

## 2022-07-23 DIAGNOSIS — Z880 Allergy status to penicillin: Secondary | ICD-10-CM | POA: Diagnosis not present

## 2022-07-23 DIAGNOSIS — K5900 Constipation, unspecified: Secondary | ICD-10-CM | POA: Diagnosis not present

## 2022-07-23 DIAGNOSIS — Z008 Encounter for other general examination: Secondary | ICD-10-CM | POA: Diagnosis not present

## 2022-07-23 DIAGNOSIS — I10 Essential (primary) hypertension: Secondary | ICD-10-CM | POA: Diagnosis not present

## 2022-07-23 DIAGNOSIS — R32 Unspecified urinary incontinence: Secondary | ICD-10-CM | POA: Diagnosis not present

## 2022-07-30 DIAGNOSIS — M9901 Segmental and somatic dysfunction of cervical region: Secondary | ICD-10-CM | POA: Diagnosis not present

## 2022-07-30 DIAGNOSIS — M47812 Spondylosis without myelopathy or radiculopathy, cervical region: Secondary | ICD-10-CM | POA: Diagnosis not present

## 2022-09-22 DIAGNOSIS — M9901 Segmental and somatic dysfunction of cervical region: Secondary | ICD-10-CM | POA: Diagnosis not present

## 2022-09-22 DIAGNOSIS — M47812 Spondylosis without myelopathy or radiculopathy, cervical region: Secondary | ICD-10-CM | POA: Diagnosis not present

## 2022-11-03 DIAGNOSIS — M47812 Spondylosis without myelopathy or radiculopathy, cervical region: Secondary | ICD-10-CM | POA: Diagnosis not present

## 2022-11-03 DIAGNOSIS — M9901 Segmental and somatic dysfunction of cervical region: Secondary | ICD-10-CM | POA: Diagnosis not present

## 2022-11-16 ENCOUNTER — Other Ambulatory Visit (HOSPITAL_COMMUNITY): Payer: Self-pay | Admitting: Internal Medicine

## 2022-11-16 DIAGNOSIS — Z1231 Encounter for screening mammogram for malignant neoplasm of breast: Secondary | ICD-10-CM

## 2022-11-17 DIAGNOSIS — M5136 Other intervertebral disc degeneration, lumbar region with discogenic back pain only: Secondary | ICD-10-CM | POA: Diagnosis not present

## 2022-11-17 DIAGNOSIS — G8929 Other chronic pain: Secondary | ICD-10-CM | POA: Diagnosis not present

## 2022-11-17 DIAGNOSIS — M545 Low back pain, unspecified: Secondary | ICD-10-CM | POA: Diagnosis not present

## 2022-11-24 DIAGNOSIS — R69 Illness, unspecified: Secondary | ICD-10-CM | POA: Diagnosis not present

## 2022-12-15 DIAGNOSIS — M47812 Spondylosis without myelopathy or radiculopathy, cervical region: Secondary | ICD-10-CM | POA: Diagnosis not present

## 2022-12-15 DIAGNOSIS — M9901 Segmental and somatic dysfunction of cervical region: Secondary | ICD-10-CM | POA: Diagnosis not present

## 2022-12-31 ENCOUNTER — Ambulatory Visit (HOSPITAL_COMMUNITY)
Admission: RE | Admit: 2022-12-31 | Discharge: 2022-12-31 | Disposition: A | Discharge status: Home / Self Care | Payer: Medicare HMO | Source: Ambulatory Visit | Attending: Internal Medicine | Admitting: Internal Medicine

## 2022-12-31 ENCOUNTER — Encounter (HOSPITAL_COMMUNITY): Payer: Self-pay

## 2022-12-31 DIAGNOSIS — Z1231 Encounter for screening mammogram for malignant neoplasm of breast: Secondary | ICD-10-CM | POA: Insufficient documentation

## 2022-12-31 DIAGNOSIS — R69 Illness, unspecified: Secondary | ICD-10-CM | POA: Diagnosis not present

## 2023-01-08 DIAGNOSIS — R69 Illness, unspecified: Secondary | ICD-10-CM | POA: Diagnosis not present

## 2023-01-12 DIAGNOSIS — M47812 Spondylosis without myelopathy or radiculopathy, cervical region: Secondary | ICD-10-CM | POA: Diagnosis not present

## 2023-01-12 DIAGNOSIS — M9901 Segmental and somatic dysfunction of cervical region: Secondary | ICD-10-CM | POA: Diagnosis not present

## 2023-01-20 ENCOUNTER — Ambulatory Visit (INDEPENDENT_AMBULATORY_CARE_PROVIDER_SITE_OTHER): Payer: Medicare HMO | Admitting: Internal Medicine

## 2023-01-20 ENCOUNTER — Encounter: Payer: Self-pay | Admitting: Internal Medicine

## 2023-01-20 VITALS — BP 107/69 | HR 73 | Temp 97.5°F | Ht 60.5 in | Wt 123.1 lb

## 2023-01-20 DIAGNOSIS — K6289 Other specified diseases of anus and rectum: Secondary | ICD-10-CM | POA: Diagnosis not present

## 2023-01-20 DIAGNOSIS — K5904 Chronic idiopathic constipation: Secondary | ICD-10-CM

## 2023-01-20 DIAGNOSIS — K5909 Other constipation: Secondary | ICD-10-CM | POA: Diagnosis not present

## 2023-01-20 DIAGNOSIS — K623 Rectal prolapse: Secondary | ICD-10-CM

## 2023-01-20 NOTE — Patient Instructions (Signed)
You can experiment with fiber therapy in regards to your bowels.  Would trial Metamucil first and see how you do.  You could also try Benefiber depending on results.  You may need to take MiraLAX on top of this.  If you decide you would like to meet with a colorectal surgeon, then let me know and I can place referral.  Otherwise follow-up in 1 year or sooner if needed.  It was very nice seeing you again today.  Dr. Marletta Lor

## 2023-01-20 NOTE — Progress Notes (Signed)
Referring Provider: Ignatius Specking, MD Primary Care Physician:  Ignatius Specking, MD Primary GI:  Dr. Marletta Lor  Chief Complaint  Patient presents with   Follow-up    Patient here today for a yearly follow up. Patient says she is having issues with constipation. She is taking Miralax 1/2-3/4 of a cup every other day. She says having a bm is a "process", at times it takes her thirty minutes to complete a bm.     HPI:   Kathy Bennett is a 67 y.o. female who presents to the clinic today for follow-up visit.  Underwent colonoscopy December 2022 for surveillance purposes/history of polyps.  No polyps or cancer identified though she did have evidence of proctitis.  Biopsies consistent with acute inflammation possible ischemia versus rectal prolapse.  Started her on MiraLAX after this and states her bowels are moving better.  She does complain that she feels part of her rectum protrudes during bowel movements and eventually retracts.  Sometimes she has to help this process.  No melena hematochezia.  No mucus in her stool.  No rectal pain or discomfort.  Has been doing pelvic floor exercises 4-5 times a week.  Minimal improvement.  Past Medical History:  Diagnosis Date   Chronic constipation    Rectal prolapse     Past Surgical History:  Procedure Laterality Date   BIOPSY  01/21/2021   Procedure: BIOPSY;  Surgeon: Lanelle Bal, DO;  Location: [facility];  Service: Endoscopy;;   COLONOSCOPY     COLONOSCOPY WITH PROPOFOL N/A 01/21/2021   Procedure: COLONOSCOPY WITH PROPOFOL;  Surgeon: Lanelle Bal, DO;  Location: [facility];  Service: Endoscopy;  Laterality: N/A;  9:45 / ASA I/ II   EYE SURGERY      Current Outpatient Medications  Medication Sig Dispense Refill   CALCIUM-VITAMIN D PO Take 1 tablet by mouth daily at 6 (six) AM.     polyethylene glycol (MIRALAX / GLYCOLAX) 17 g packet Take 17 g by mouth as needed.     No current facility-administered medications for this  visit.    Allergies as of 01/20/2023 - Review Complete 01/20/2023  Allergen Reaction Noted   Penicillins Hives    Azithromycin Hives 01/15/2021    History reviewed. No pertinent family history.  Social History   Socioeconomic History   Marital status: Married    Spouse name: Not on file   Number of children: Not on file   Years of education: Not on file   Highest education level: Not on file  Occupational History   Not on file  Tobacco Use   Smoking status: Never    Passive exposure: Past   Smokeless tobacco: Never  Vaping Use   Vaping status: Never Used  Substance and Sexual Activity   Alcohol use: Yes    Comment: occasional   Drug use: Never   Sexual activity: Not on file  Other Topics Concern   Not on file  Social History Narrative   Not on file   Social Determinants of Health   Financial Resource Strain: Not on file  Food Insecurity: Not on file  Transportation Needs: Not on file  Physical Activity: Not on file  Stress: Not on file  Social Connections: Not on file    Subjective: Review of Systems  Constitutional:  Negative for chills and fever.  HENT:  Negative for congestion and hearing loss.   Eyes:  Negative for blurred vision and double vision.  Respiratory:  Negative for cough and shortness of breath.   Cardiovascular:  Negative for chest pain and palpitations.  Gastrointestinal:  Positive for constipation. Negative for abdominal pain, blood in stool, diarrhea, heartburn, melena and vomiting.       Straining  Genitourinary:  Negative for dysuria and urgency.  Musculoskeletal:  Negative for joint pain and myalgias.  Skin:  Negative for itching and rash.  Neurological:  Negative for dizziness and headaches.  Psychiatric/Behavioral:  Negative for depression. The patient is not nervous/anxious.      Objective: BP 107/69 (BP Location: Left Arm, Patient Position: Sitting, Cuff Size: Normal)   Pulse 73   Temp (!) 97.5 F (36.4 C) (Temporal)   Ht  5' 0.5" (1.537 m)   Wt 123 lb 1.6 oz (55.8 kg)   BMI 23.65 kg/m  Physical Exam Constitutional:      Appearance: Normal appearance.  HENT:     Head: Normocephalic and atraumatic.  Eyes:     Extraocular Movements: Extraocular movements intact.     Conjunctiva/sclera: Conjunctivae normal.  Cardiovascular:     Rate and Rhythm: Normal rate and regular rhythm.  Pulmonary:     Effort: Pulmonary effort is normal.     Breath sounds: Normal breath sounds.  Abdominal:     General: Bowel sounds are normal.     Palpations: Abdomen is soft.  Musculoskeletal:        General: No swelling. Normal range of motion.     Cervical back: Normal range of motion and neck supple.  Skin:    General: Skin is warm and dry.     Coloration: Skin is not jaundiced.  Neurological:     General: No focal deficit present.     Mental Status: She is alert and oriented to person, place, and time.  Psychiatric:        Mood and Affect: Mood normal.        Behavior: Behavior normal.      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  Symptoms do seem consistent with rectal prolapse which would explain her rectal biopsies.   Doing pelvic floor exercises 4-5 times a week.  Minimal improvement.  Discussed surgical referral as well to discuss possible surgical correction of this.  She would like to think about this for now.  If she would like to pursue further, she will call office and I will make referral to colorectal surgery to discuss further.  Recommended adding Metamucil 1-2x daily. Recommended she drink at least 6 glasses of water daily.   Follow up in 1 year or sooner if needed.   01/20/2023 11:30 AM   Disclaimer: This note was dictated with voice recognition software. Similar sounding words can inadvertently be transcribed and may not be corrected upon review.

## 2023-01-28 DIAGNOSIS — R69 Illness, unspecified: Secondary | ICD-10-CM | POA: Diagnosis not present

## 2023-02-18 DIAGNOSIS — M9901 Segmental and somatic dysfunction of cervical region: Secondary | ICD-10-CM | POA: Diagnosis not present

## 2023-02-18 DIAGNOSIS — M47812 Spondylosis without myelopathy or radiculopathy, cervical region: Secondary | ICD-10-CM | POA: Diagnosis not present

## 2023-03-25 DIAGNOSIS — M47812 Spondylosis without myelopathy or radiculopathy, cervical region: Secondary | ICD-10-CM | POA: Diagnosis not present

## 2023-03-25 DIAGNOSIS — M9901 Segmental and somatic dysfunction of cervical region: Secondary | ICD-10-CM | POA: Diagnosis not present

## 2023-03-26 IMAGING — MG MM DIGITAL SCREENING BILAT W/ TOMO AND CAD
8 series · 9 of 24 positions shown · non-contrast
Comparison: Previous exam(s).

CLINICAL DATA: Screening.

EXAM:
DIGITAL SCREENING BILATERAL MAMMOGRAM WITH TOMOSYNTHESIS AND CAD
TECHNIQUE: Bilateral screening digital craniocaudal and mediolateral oblique
mammograms were obtained. Bilateral screening digital breast
tomosynthesis was performed. The images were evaluated with
computer-aided detection.

[R CC synth-2D]
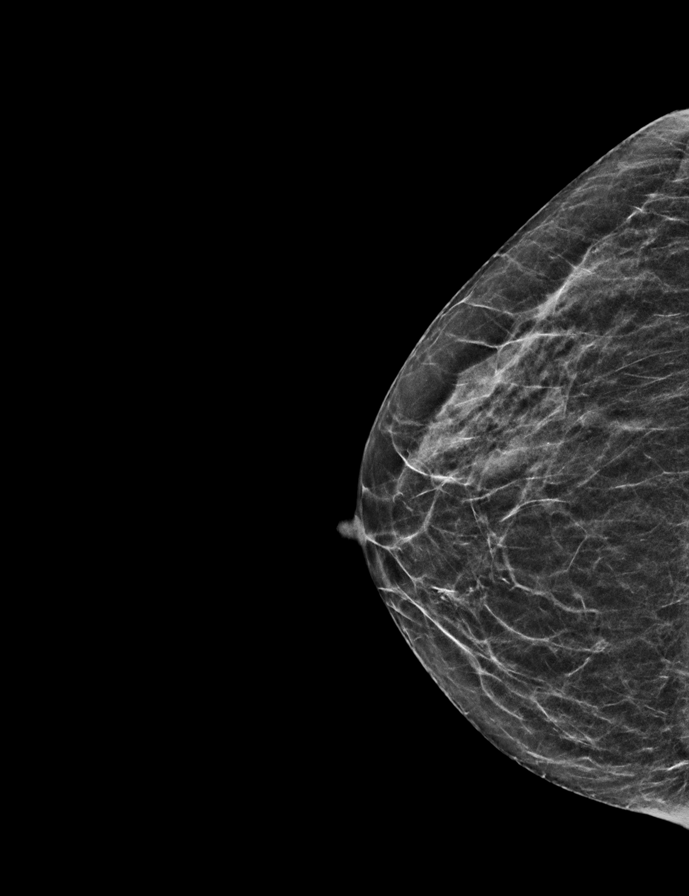

[L CC synth-2D]
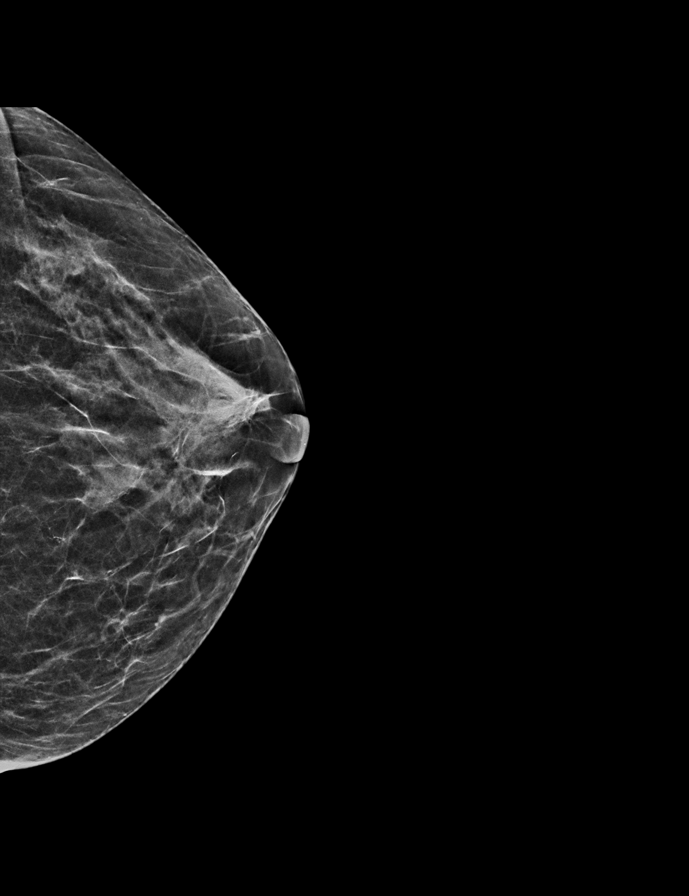

[L MLO synth-2D]
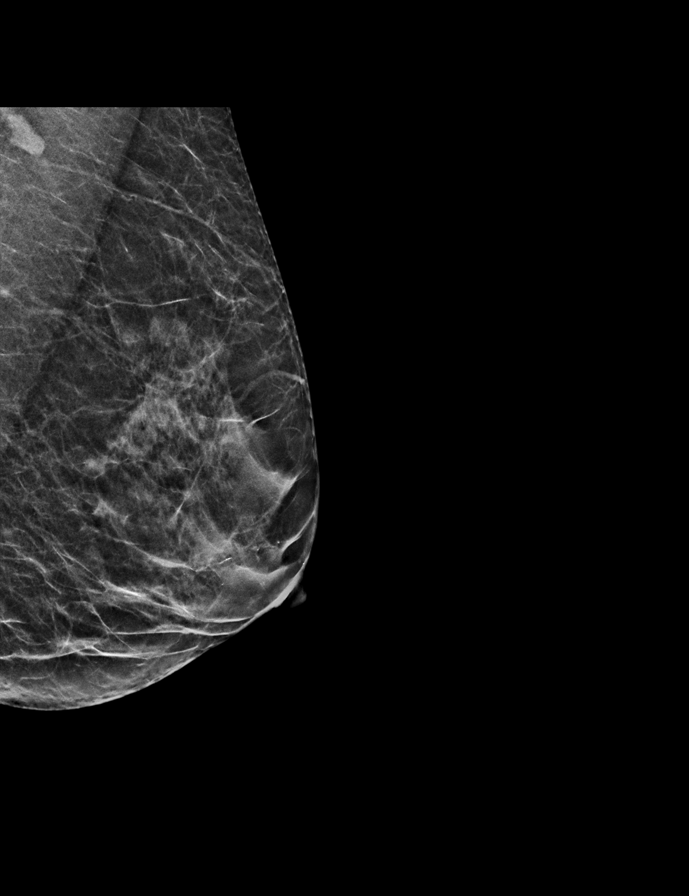

[R MLO synth-2D]
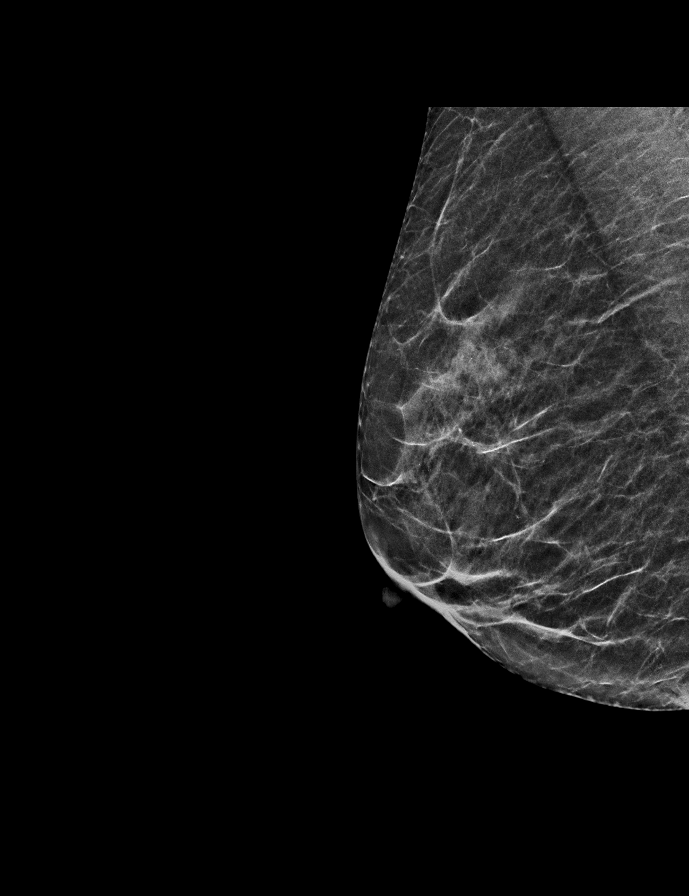

[L MLO tomo · 2 of 42 frames shown]
[frame 14/42]
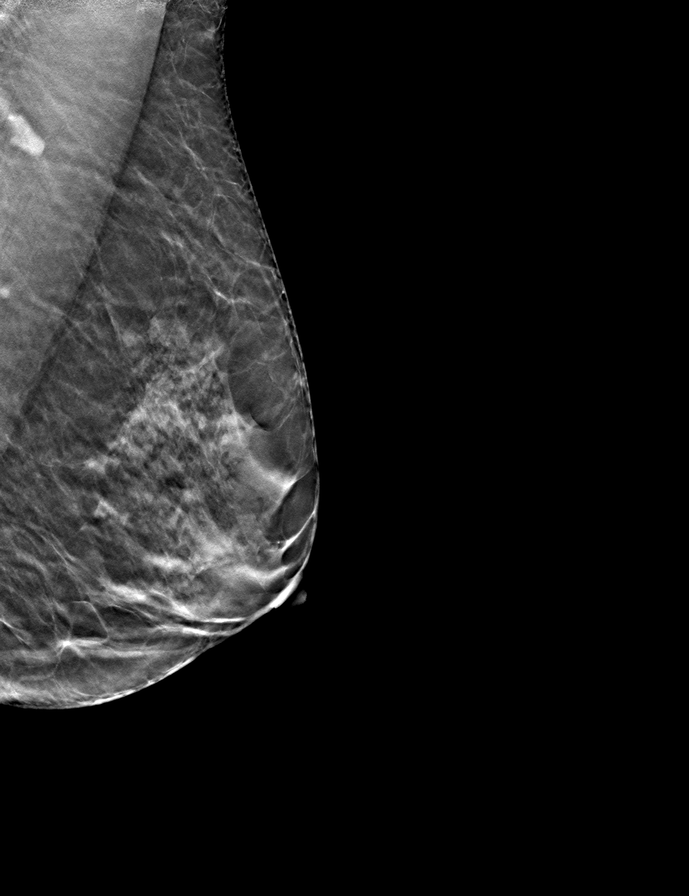
[frame 21/42]
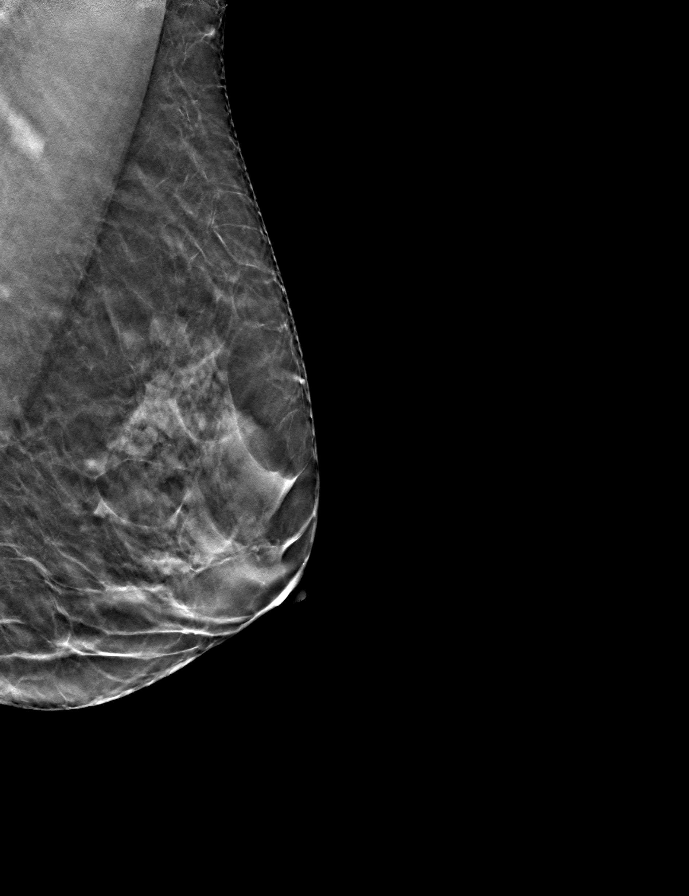

[L CC tomo · tomo slice 21/40.0]
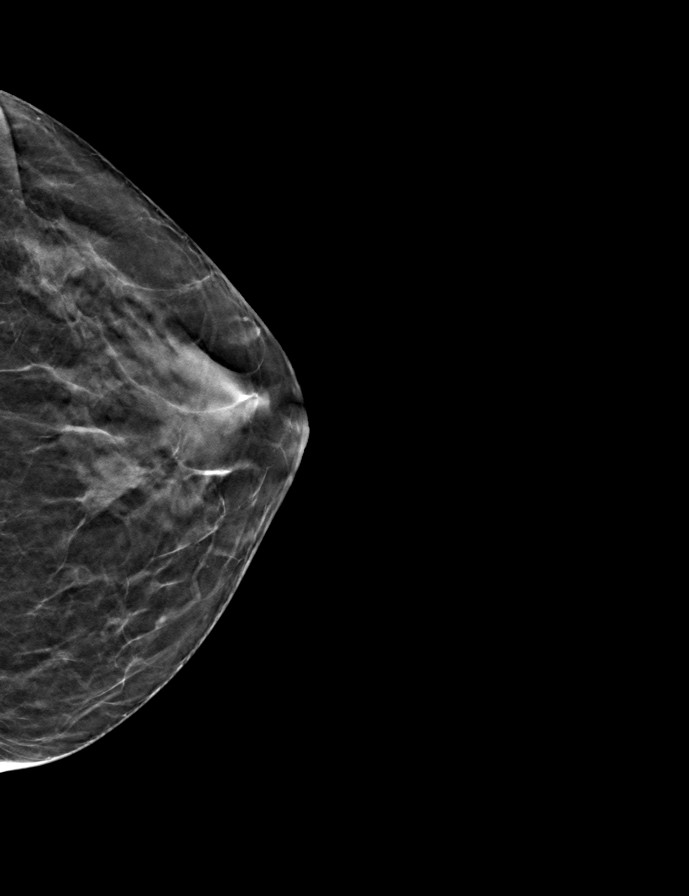

[R CC tomo · tomo slice 21/41.0]
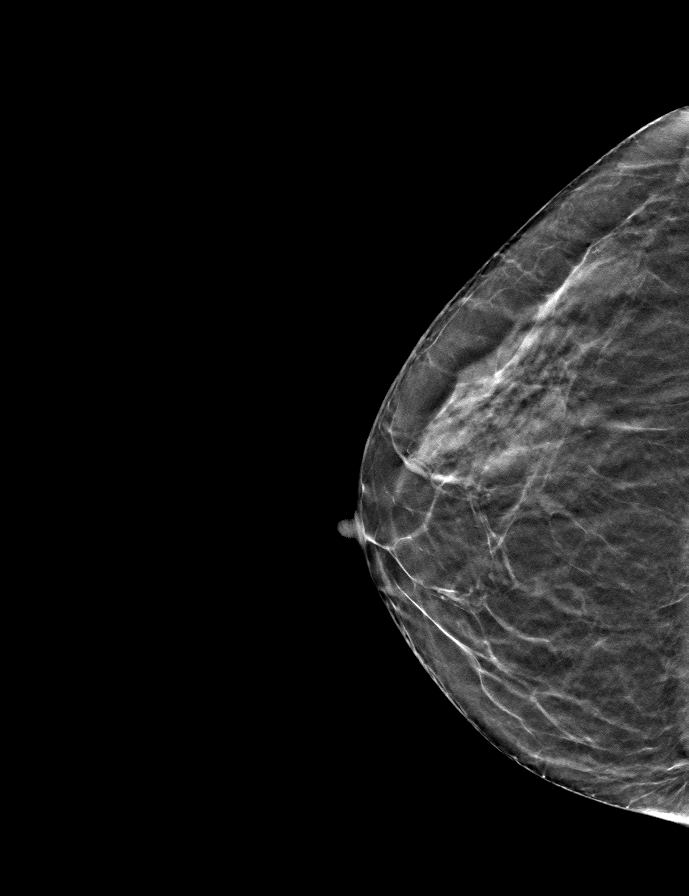

[R MLO tomo · tomo slice 21/42.0]
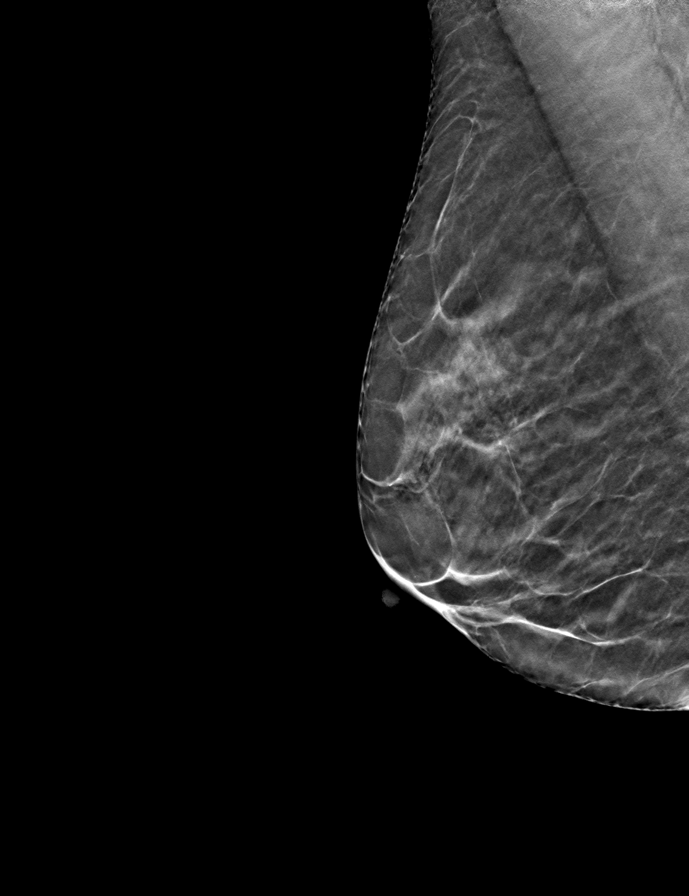

[9 of 24 positions shown; findings below may reference images not displayed]

ACR Breast Density Category c: The breast tissue is heterogeneously
dense, which may obscure small masses.
FINDINGS: There are no findings suspicious for malignancy.
IMPRESSION: No mammographic evidence of malignancy. A result letter of this
screening mammogram will be mailed directly to the patient.

RECOMMENDATION:
Screening mammogram in one year. (Code:Q3-W-BC3)

BI-RADS CATEGORY  1: Negative.

## 2023-04-17 ENCOUNTER — Ambulatory Visit
Admission: EM | Admit: 2023-04-17 | Discharge: 2023-04-17 | Disposition: A | Discharge status: Home / Self Care | Attending: Family Medicine | Admitting: Family Medicine

## 2023-04-17 DIAGNOSIS — J111 Influenza due to unidentified influenza virus with other respiratory manifestations: Secondary | ICD-10-CM | POA: Diagnosis not present

## 2023-04-17 LAB — POC COVID19/FLU A&B COMBO
Covid Antigen, POC: NEGATIVE
Influenza A Antigen, POC: NEGATIVE
Influenza B Antigen, POC: NEGATIVE

## 2023-04-17 MED ORDER — OSELTAMIVIR PHOSPHATE 75 MG PO CAPS: 75.0000 mg | ORAL_CAPSULE | Freq: Two times a day (BID) | ORAL | 0 refills | Status: DC

## 2023-04-17 NOTE — ED Triage Notes (Signed)
 Pt reports fever, headache, chills, diarrhea, and body aches x 1 day

## 2023-04-17 NOTE — ED Provider Notes (Signed)
 [facility]    CSN: [id number] Arrival date & time: 04/17/23  1424      History   Chief Complaint Chief Complaint  Patient presents with   Exposed to flu    HPI Kathy Bennett is a 68 y.o. female.   Patient presenting today with 1 day history of fever, headache, chills, diarrhea, body aches, congestion, fatigue.  Denies chest pain, shortness of breath, abdominal pain, vomiting, diarrhea.  So far trying Tylenol with minimal relief.  Husband diagnosed with flu yesterday.    Past Medical History:  Diagnosis Date   Chronic constipation    Rectal prolapse     Patient Active Problem List   Diagnosis Date Noted   GERD 06/08/2009   DYSPEPSIA 05/21/2009   DYSPHAGIA 03/27/2009    Past Surgical History:  Procedure Laterality Date   BIOPSY  01/21/2021   Procedure: BIOPSY;  Surgeon: Lanelle Bal, DO;  [facility]: [facility];  Service: Endoscopy;;   COLONOSCOPY     COLONOSCOPY WITH PROPOFOL N/A 01/21/2021   Procedure: COLONOSCOPY WITH PROPOFOL;  Surgeon: Lanelle Bal, DO;  [facility]: [facility];  Service: Endoscopy;  Laterality: N/A;  9:45 / ASA I/ II   EYE SURGERY      OB History   No obstetric history on file.      Home Medications    Prior to Admission medications   Medication Sig Start Date End Date Taking? Authorizing Provider  oseltamivir (TAMIFLU) 75 MG capsule Take 1 capsule (75 mg total) by mouth every 12 (twelve) hours. 04/17/23  Yes Particia Nearing, PA-C  CALCIUM-VITAMIN D PO Take 1 tablet by mouth daily at 6 (six) AM.    [provider]  polyethylene glycol (MIRALAX / GLYCOLAX) 17 g packet Take 17 g by mouth as needed.    [provider]    Family History History reviewed. No pertinent family history.  Social History Social History   Tobacco Use   Smoking status: Never    Passive exposure: Past   Smokeless tobacco: Never  Vaping Use   Vaping status: Never Used  Substance Use Topics   Alcohol use:  Yes    Comment: occasional   Drug use: Never     Allergies   Penicillins and Azithromycin   Review of Systems Review of Systems PER HPI  Physical Exam Triage Vital Signs ED Triage Vitals  Encounter Vitals Group     BP 04/17/23 1506 132/85     Systolic BP Percentile --      Diastolic BP Percentile --      Pulse Rate 04/17/23 1506 84     Resp 04/17/23 1506 20     Temp 04/17/23 1506 99.3 F (37.4 C)     Temp Source 04/17/23 1506 Oral     SpO2 04/17/23 1506 98 %     Weight --      Height --      Head Circumference --      Peak Flow --      Pain Score 04/17/23 1507 6     Pain Loc --      Pain Education --      Exclude from Growth Chart --    No data found.  Updated Vital Signs BP 132/85 (BP Location: Right Arm)   Pulse 84   Temp 99.3 F (37.4 C) (Oral)   Resp 20   SpO2 98%   Visual Acuity Right Eye Distance:   Left Eye Distance:  Bilateral Distance:    Right Eye Near:   Left Eye Near:    Bilateral Near:     Physical Exam Vitals and nursing note reviewed.  Constitutional:      Appearance: Normal appearance.  HENT:     Head: Atraumatic.     Right Ear: Tympanic membrane and external ear normal.     Left Ear: Tympanic membrane and external ear normal.     Nose: Rhinorrhea present.     Mouth/Throat:     Mouth: Mucous membranes are moist.     Pharynx: Posterior oropharyngeal erythema present.  Eyes:     Extraocular Movements: Extraocular movements intact.     Conjunctiva/sclera: Conjunctivae normal.  Cardiovascular:     Rate and Rhythm: Normal rate and regular rhythm.     Heart sounds: Normal heart sounds.  Pulmonary:     Effort: Pulmonary effort is normal.     Breath sounds: Normal breath sounds. No wheezing or rales.  Musculoskeletal:        General: Normal range of motion.     Cervical back: Normal range of motion and neck supple.  Skin:    General: Skin is warm and dry.  Neurological:     Mental Status: She is alert and oriented to person,  place, and time.  Psychiatric:        Mood and Affect: Mood normal.        Thought Content: Thought content normal.      UC Treatments / Results  Labs (all labs ordered are listed, but only abnormal results are displayed) Labs Reviewed  POC COVID19/FLU A&B COMBO    EKG   Radiology No results found.  Procedures Procedures (including critical care time)  Medications Ordered in UC Medications - No data to display  Initial Impression / Assessment and Plan / UC Course  I have reviewed the triage vital signs and the nursing notes.  Pertinent labs & imaging results that were available during my care of the patient were reviewed by me and considered in my medical decision making (see chart for details).     Rapid flu and COVID-negative but given consistent symptoms and home exposure to influenza will treat with Tamiflu, supportive over-the-counter medications and home care.  Return for worsening symptoms.  Final Clinical Impressions(s) / UC Diagnoses   Final diagnoses:  Influenza   Discharge Instructions   None    ED Prescriptions     Medication Sig Dispense Auth. Provider   oseltamivir (TAMIFLU) 75 MG capsule Take 1 capsule (75 mg total) by mouth every 12 (twelve) hours. 10 capsule Particia Nearing, [place]      PDMP not reviewed this encounter.   Particia Nearing, [place] 04/17/23 1537

## 2023-04-27 DIAGNOSIS — M9901 Segmental and somatic dysfunction of cervical region: Secondary | ICD-10-CM | POA: Diagnosis not present

## 2023-04-27 DIAGNOSIS — M47812 Spondylosis without myelopathy or radiculopathy, cervical region: Secondary | ICD-10-CM | POA: Diagnosis not present

## 2023-04-28 DIAGNOSIS — D225 Melanocytic nevi of trunk: Secondary | ICD-10-CM | POA: Diagnosis not present

## 2023-04-28 DIAGNOSIS — L308 Other specified dermatitis: Secondary | ICD-10-CM | POA: Diagnosis not present

## 2023-04-28 DIAGNOSIS — X32XXXD Exposure to sunlight, subsequent encounter: Secondary | ICD-10-CM | POA: Diagnosis not present

## 2023-04-28 DIAGNOSIS — L570 Actinic keratosis: Secondary | ICD-10-CM | POA: Diagnosis not present

## 2023-04-28 DIAGNOSIS — Z1283 Encounter for screening for malignant neoplasm of skin: Secondary | ICD-10-CM | POA: Diagnosis not present

## 2023-06-01 DIAGNOSIS — H5213 Myopia, bilateral: Secondary | ICD-10-CM | POA: Diagnosis not present

## 2023-07-21 DIAGNOSIS — Z83511 Family history of glaucoma: Secondary | ICD-10-CM | POA: Diagnosis not present

## 2023-07-21 DIAGNOSIS — H40053 Ocular hypertension, bilateral: Secondary | ICD-10-CM | POA: Diagnosis not present

## 2023-07-21 DIAGNOSIS — H2513 Age-related nuclear cataract, bilateral: Secondary | ICD-10-CM | POA: Diagnosis not present

## 2023-08-07 ENCOUNTER — Other Ambulatory Visit: Payer: Self-pay

## 2023-08-07 ENCOUNTER — Encounter: Payer: Self-pay | Admitting: Emergency Medicine

## 2023-08-07 ENCOUNTER — Ambulatory Visit
Admission: EM | Admit: 2023-08-07 | Discharge: 2023-08-07 | Disposition: A | Discharge status: Home / Self Care | Attending: Family Medicine | Admitting: Family Medicine

## 2023-08-07 DIAGNOSIS — N390 Urinary tract infection, site not specified: Secondary | ICD-10-CM

## 2023-08-07 DIAGNOSIS — R300 Dysuria: Secondary | ICD-10-CM | POA: Diagnosis not present

## 2023-08-07 LAB — POCT URINALYSIS DIP (MANUAL ENTRY)
Bilirubin, UA: NEGATIVE
Glucose, UA: NEGATIVE mg/dL
Ketones, POC UA: NEGATIVE mg/dL
Nitrite, UA: NEGATIVE
Spec Grav, UA: 1.015 (ref 1.010–1.025)
Urobilinogen, UA: 0.2 U/dL
pH, UA: 6 (ref 5.0–8.0)

## 2023-08-07 MED ORDER — SULFAMETHOXAZOLE-TRIMETHOPRIM 800-160 MG PO TABS: 1.0000 | ORAL_TABLET | Freq: Two times a day (BID) | ORAL | 0 refills | Status: AC

## 2023-08-07 NOTE — ED Triage Notes (Signed)
 Pt reports urinary urgency, pressure, dysuria intermittently x5 days. Denies any abd pain, fever, nausea.

## 2023-08-07 NOTE — ED Provider Notes (Signed)
 [facility]    CSN: [id number] Arrival date & time: 08/07/23  9071      History   Chief Complaint Chief Complaint  Patient presents with   Dysuria    HPI Kathy Bennett is a 68 y.o. female.   Patient presenting today with about 5 days of urinary urgency, frequency, pressure, dysuria.  Denies fever, chills, nausea, vomiting, flank pain, hematuria.  So far not trying anything over-the-counter for symptoms.    Past Medical History:  Diagnosis Date   Chronic constipation    Rectal prolapse     Patient Active Problem List   Diagnosis Date Noted   GERD 06/08/2009   DYSPEPSIA 05/21/2009   DYSPHAGIA 03/27/2009    Past Surgical History:  Procedure Laterality Date   BIOPSY  01/21/2021   Procedure: BIOPSY;  Surgeon: Cindie Carlin POUR, DO;  [facility]: [facility];  Service: Endoscopy;;   COLONOSCOPY     COLONOSCOPY WITH PROPOFOL  N/A 01/21/2021   Procedure: COLONOSCOPY WITH PROPOFOL ;  Surgeon: Cindie Carlin POUR, DO;  [facility]: [facility];  Service: Endoscopy;  Laterality: N/A;  9:45 / ASA I/ II   EYE SURGERY      OB History   No obstetric history on file.      Home Medications    Prior to Admission medications   Medication Sig Start Date End Date Taking? Authorizing Provider  latanoprost (XALATAN) 0.005 % ophthalmic solution 1 drop at bedtime. 07/21/23  Yes [provider]  sulfamethoxazole-trimethoprim (BACTRIM DS) 800-160 MG tablet Take 1 tablet by mouth 2 (two) times daily for 3 days. 08/07/23 08/10/23 Yes Stuart Vernell Norris, PA-C  CALCIUM-VITAMIN D PO Take 1 tablet by mouth daily at 6 (six) AM.    [provider]  oseltamivir  (TAMIFLU ) 75 MG capsule Take 1 capsule (75 mg total) by mouth every 12 (twelve) hours. 04/17/23   Stuart Vernell Norris, PA-C  polyethylene glycol (MIRALAX / GLYCOLAX) 17 g packet Take 17 g by mouth as needed.    [provider]  zolpidem (AMBIEN) 5 MG tablet Take 1 po qhs prn    [provider]    Family History History reviewed. No pertinent family history.  Social History Social History   Tobacco Use   Smoking status: Never    Passive exposure: Past   Smokeless tobacco: Never  Vaping Use   Vaping status: Never Used  Substance Use Topics   Alcohol  use: Yes    Comment: occasional   Drug use: Never     Allergies   Penicillins and Azithromycin   Review of Systems Review of Systems Per HPI  Physical Exam Triage Vital Signs ED Triage Vitals  Encounter Vitals Group     BP 08/07/23 1002 130/84     Girls Systolic BP Percentile --      Girls Diastolic BP Percentile --      Boys Systolic BP Percentile --      Boys Diastolic BP Percentile --      Pulse Rate 08/07/23 1002 71     Resp 08/07/23 1002 20     Temp 08/07/23 1002 97.7 F (36.5 C)     Temp Source 08/07/23 1002 Oral     SpO2 08/07/23 1002 98 %     Weight --      Height --      Head Circumference --      Peak Flow --      Pain Score 08/07/23 1000 2  Pain Loc --      Pain Education --      Exclude from Growth Chart --    No data found.  Updated Vital Signs BP 130/84 (BP Location: Right Arm)   Pulse 71   Temp 97.7 F (36.5 C) (Oral)   Resp 20   SpO2 98%   Visual Acuity Right Eye Distance:   Left Eye Distance:   Bilateral Distance:    Right Eye Near:   Left Eye Near:    Bilateral Near:     Physical Exam Vitals and nursing note reviewed.  Constitutional:      Appearance: Normal appearance. She is not ill-appearing.  HENT:     Head: Atraumatic.   Eyes:     Extraocular Movements: Extraocular movements intact.     Conjunctiva/sclera: Conjunctivae normal.    Cardiovascular:     Rate and Rhythm: Normal rate.  Pulmonary:     Effort: Pulmonary effort is normal.  Abdominal:     General: Abdomen is flat. There is no distension.     Palpations: Abdomen is soft.     Tenderness: There is no abdominal tenderness. There is no right CVA tenderness, left CVA tenderness or  guarding.   Musculoskeletal:        General: Normal range of motion.     Cervical back: Normal range of motion and neck supple.   Skin:    General: Skin is warm and dry.   Neurological:     Mental Status: She is alert and oriented to person, place, and time.   Psychiatric:        Mood and Affect: Mood normal.        Thought Content: Thought content normal.        Judgment: Judgment normal.      UC Treatments / Results  Labs (all labs ordered are listed, but only abnormal results are displayed) Labs Reviewed  POCT URINALYSIS DIP (MANUAL ENTRY) - Abnormal; Notable for the following components:      Result Value   Clarity, UA cloudy (*)    Blood, UA large (*)    Protein Ur, POC trace (*)    Leukocytes, UA Large (3+) (*)    All other components within normal limits  URINE CULTURE    EKG   Radiology No results found.  Procedures Procedures (including critical care time)  Medications Ordered in UC Medications - No data to display  Initial Impression / Assessment and Plan / UC Course  I have reviewed the triage vital signs and the nursing notes.  Pertinent labs & imaging results that were available during my care of the patient were reviewed by me and considered in my medical decision making (see chart for details).     Urinalysis today with evidence of a urinary tract infection.  Will treat with Bactrim while awaiting urine culture and adjust if needed.  Azo as needed, push fluids.  Return for significantly worsening symptoms.  Final Clinical Impressions(s) / UC Diagnoses   Final diagnoses:  Acute lower UTI   Discharge Instructions   None    ED Prescriptions     Medication Sig Dispense Auth. Provider   sulfamethoxazole-trimethoprim (BACTRIM DS) 800-160 MG tablet Take 1 tablet by mouth 2 (two) times daily for 3 days. 6 tablet Stuart Vernell Norris, [place]      PDMP not reviewed this encounter.   Stuart Vernell Norris, [place] 08/07/23 1054

## 2023-08-09 ENCOUNTER — Ambulatory Visit (HOSPITAL_COMMUNITY): Payer: Self-pay

## 2023-08-09 ENCOUNTER — Telehealth (INDEPENDENT_AMBULATORY_CARE_PROVIDER_SITE_OTHER): Payer: Self-pay | Admitting: Internal Medicine

## 2023-08-09 ENCOUNTER — Other Ambulatory Visit: Payer: Self-pay | Admitting: *Deleted

## 2023-08-09 DIAGNOSIS — K623 Rectal prolapse: Secondary | ICD-10-CM

## 2023-08-09 DIAGNOSIS — K6289 Other specified diseases of anus and rectum: Secondary | ICD-10-CM

## 2023-08-09 LAB — URINE CULTURE: Culture: 20000 — AB

## 2023-08-09 NOTE — Telephone Encounter (Signed)
 Patient came to front office [facility]) saying she was a patient of Dr Cindie and was wanting to follow up on being referred to a ?proctologist. I told her Dr Cindie was not in today and he is usually at [facility] and I would forward a message of to the scheduler/nurse there. [phone]

## 2023-08-09 NOTE — Telephone Encounter (Signed)
 Spoke with Dr. Cindie, OKAY TO SEND REFERRAL

## 2023-08-10 NOTE — Telephone Encounter (Signed)
 noted

## 2023-08-31 ENCOUNTER — Ambulatory Visit: Payer: Self-pay | Admitting: Surgery

## 2023-08-31 DIAGNOSIS — K5909 Other constipation: Secondary | ICD-10-CM | POA: Diagnosis not present

## 2023-08-31 DIAGNOSIS — K623 Rectal prolapse: Secondary | ICD-10-CM | POA: Diagnosis not present

## 2023-08-31 DIAGNOSIS — K6289 Other specified diseases of anus and rectum: Secondary | ICD-10-CM | POA: Diagnosis not present

## 2023-08-31 DIAGNOSIS — R739 Hyperglycemia, unspecified: Secondary | ICD-10-CM

## 2023-09-01 DIAGNOSIS — M65942 Unspecified synovitis and tenosynovitis, left hand: Secondary | ICD-10-CM | POA: Diagnosis not present

## 2023-09-01 DIAGNOSIS — M79641 Pain in right hand: Secondary | ICD-10-CM | POA: Diagnosis not present

## 2023-09-01 DIAGNOSIS — M65941 Unspecified synovitis and tenosynovitis, right hand: Secondary | ICD-10-CM | POA: Diagnosis not present

## 2023-09-01 DIAGNOSIS — M79642 Pain in left hand: Secondary | ICD-10-CM | POA: Diagnosis not present

## 2023-09-22 DIAGNOSIS — H40053 Ocular hypertension, bilateral: Secondary | ICD-10-CM | POA: Diagnosis not present

## 2023-09-22 DIAGNOSIS — Z83511 Family history of glaucoma: Secondary | ICD-10-CM | POA: Diagnosis not present

## 2023-09-22 DIAGNOSIS — H2513 Age-related nuclear cataract, bilateral: Secondary | ICD-10-CM | POA: Diagnosis not present

## 2023-09-22 DIAGNOSIS — H401131 Primary open-angle glaucoma, bilateral, mild stage: Secondary | ICD-10-CM | POA: Diagnosis not present

## 2023-10-13 ENCOUNTER — Encounter (HOSPITAL_COMMUNITY): Payer: Self-pay

## 2023-10-13 NOTE — Progress Notes (Signed)
 PCP - Leta Fear, MD [facility] medical  Cardiologist -   PPM/ICD -  Device Orders -  Rep Notified -   Chest x-ray -  EKG -  Stress Test -  ECHO -  Cardiac Cath -   Sleep Study -  CPAP -   Fasting Blood Sugar -  Checks Blood Sugar _____ times a day  Blood Thinner Instructions: Aspirin Instructions:  ERAS Protcol - PRE-SURGERY Ensure or G2-    COVID vaccine -  Activity-- Anesthesia review:   Patient denies shortness of breath, fever, cough and chest pain at PAT appointment   All instructions explained to the patient, with a verbal understanding of the material. Patient agrees to go over the instructions while at home for a better understanding. Patient also instructed to self quarantine after being tested for COVID-19. The opportunity to ask questions was provided.

## 2023-10-13 NOTE — Patient Instructions (Addendum)
 SURGICAL WAITING ROOM VISITATION  Patients having surgery or a procedure may have no more than 2 support people in the waiting area - these visitors may rotate.    Children under the age of 65 must have an adult with them who is not the patient.  Visitors with respiratory illnesses are discouraged from visiting and should remain at home.  If the patient needs to stay at the hospital during part of their recovery, the visitor guidelines for inpatient rooms apply. Pre-op nurse will coordinate an appropriate time for 1 support person to accompany patient in pre-op.  This support person may not rotate.    Please refer to the [facility] website for the visitor guidelines for Inpatients (after your surgery is over and you are in a regular room).       Your procedure is scheduled on: 10-20-23   Report to [facility] Main Entrance    Report to admitting at        0630  AM   Call this number if you have problems the morning of surgery [phone]   FOLLOW A CLEAR LIQUID DIET THE DAY OF YOUR BOWEL PREP TO PREVENT DEHYDRATION       After Midnight you may CONTINUE  following liquids until _0530_____ AM/ DAY OF SURGERY   Then nothing by mouth  Water Non-Citrus Juices (without pulp, NO RED-Apple, White grape, White cranberry) Black Coffee (NO MILK/CREAM OR CREAMERS, sugar ok)  Clear Tea (NO MILK/CREAM OR CREAMERS, sugar ok) regular and decaf                             Plain Jell-O (NO RED)                                           Fruit ices (not with fruit pulp, NO RED)                                     Popsicles (NO RED)                                                               Sports drinks like Gatorade (NO RED)              Drink 2 Ensure/G2 drinks AT 10:00 PM the night before surgery.        The day of surgery:  Drink ONE (1) Pre-Surgery Clear Ensure BY    0530 AM the morning of surgery. Drink in one sitting. Do not sip.  This drink was given to you during your  hospital  pre-op appointment visit. Nothing else to drink after completing the  Pre-Surgery Clear Ensure or G2.          If you have questions, please contact your surgeon's office.   FOLLOW BOWEL PREP AND ANY ADDITIONAL PRE OP INSTRUCTIONS YOU RECEIVED FROM YOUR SURGEON'S OFFICE!!!     Oral Hygiene is also important to reduce your risk of infection.  Remember - BRUSH YOUR TEETH THE MORNING OF SURGERY WITH YOUR REGULAR TOOTHPASTE  DENTURES WILL BE REMOVED PRIOR TO SURGERY PLEASE DO NOT APPLY Poly grip OR ADHESIVES!!!   Do NOT smoke after Midnight   Stop all vitamins and herbal supplements 7 days before surgery.   Take these medicines the morning of surgery with A SIP OF WATER: NONE                               You may not have any metal on your body including hair pins, jewelry, and body piercing             Do not wear make-up, lotions, powders, perfumes/cologne, or deodorant  Do not wear nail polish including gel and S&S, artificial/acrylic nails, or any other type of covering on natural nails including finger and toenails. If you have artificial nails, gel coating, etc. that needs to be removed by a nail salon please have this removed prior to surgery or surgery may need to be canceled/ delayed if the surgeon/ anesthesia feels like they are unable to be safely monitored.   Do not shave  48 hours prior to surgery.              Do not bring valuables to the hospital. [facility] IS NOT             RESPONSIBLE   FOR VALUABLES.   Contacts, glasses, dentures or bridgework may not be worn into surgery.   Bring small overnight bag day of surgery.   DO NOT BRING YOUR HOME MEDICATIONS TO THE HOSPITAL. PHARMACY WILL DISPENSE MEDICATIONS LISTED ON YOUR MEDICATION LIST TO YOU DURING YOUR ADMISSION IN THE HOSPITAL!    Patients discharged on the day of surgery will not be allowed to drive home.  Someone NEEDS to stay with you for the first 24 hours  after anesthesia.   Special Instructions: Bring a copy of your healthcare power of attorney and living will documents the day of surgery if you haven't scanned them before.              Please read over the following fact sheets you were given: IF YOU HAVE QUESTIONS ABOUT YOUR PRE-OP INSTRUCTIONS PLEASE CALL [phone].    If you test positive for Covid or have been in contact with anyone that has tested positive in the last 10 days please notify you surgeon.    [place] - Preparing for Surgery Before surgery, you can play an important role.  Because skin is not sterile, your skin needs to be as free of germs as possible.  You can reduce the number of germs on your skin by washing with CHG (chlorahexidine gluconate) soap before surgery.  CHG is an antiseptic cleaner which kills germs and bonds with the skin to continue killing germs even after washing. Please DO NOT use if you have an allergy to CHG or antibacterial soaps.  If your skin becomes reddened/irritated stop using the CHG and inform your nurse when you arrive at Short Stay. Do not shave (including legs and underarms) for at least 48 hours prior to the first CHG shower.  You may shave your face/neck. Please follow these instructions carefully:  1.  Shower with CHG Soap the night before surgery and the  morning of Surgery.  2.  If you choose to wash your hair, wash your hair first as usual with your  normal  shampoo.  3.  After you shampoo, rinse your hair and body thoroughly to remove the  shampoo.                           4.  Use CHG as you would any other liquid soap.  You can apply chg directly  to the skin and wash                       Gently with a scrungie or clean washcloth.  5.  Apply the CHG Soap to your body ONLY FROM THE NECK DOWN.   Do not use on face/ open                           Wound or open sores. Avoid contact with eyes, ears mouth and genitals (private parts).                       Wash face,  Genitals (private  parts) with your normal soap.             6.  Wash thoroughly, paying special attention to the area where your surgery  will be performed.  7.  Thoroughly rinse your body with warm water from the neck down.  8.  DO NOT shower/wash with your normal soap after using and rinsing off  the CHG Soap.                9.  Pat yourself dry with a clean towel.            10.  Wear clean pajamas.            11.  Place clean sheets on your bed the night of your first shower and do not  sleep with pets. Day of Surgery : Do not apply any lotions/deodorants the morning of surgery.  Please wear clean clothes to the hospital/surgery center.  FAILURE TO FOLLOW THESE INSTRUCTIONS MAY RESULT IN THE CANCELLATION OF YOUR SURGERY PATIENT SIGNATURE_________________________________  NURSE SIGNATURE__________________________________  ________________________________________________________________________ WHAT IS A BLOOD TRANSFUSION? Blood Transfusion Information  A transfusion is the replacement of blood or some of its parts. Blood is made up of multiple cells which provide different functions. Red blood cells carry oxygen and are used for blood loss replacement. White blood cells fight against infection. Platelets control bleeding. Plasma helps clot blood. Other blood products are available for specialized needs, such as hemophilia or other clotting disorders. BEFORE THE TRANSFUSION  Who gives blood for transfusions?  Healthy volunteers who are fully evaluated to make sure their blood is safe. This is blood bank blood. Transfusion therapy is the safest it has ever been in the practice of medicine. Before blood is taken from a donor, a complete history is taken to make sure that person has no history of diseases nor engages in risky social behavior (examples are intravenous drug use or sexual activity with multiple partners). The donor's travel history is screened to minimize risk of transmitting infections, such  as malaria. The donated blood is tested for signs of infectious diseases, such as HIV and hepatitis. The blood is then tested to be sure it is compatible with you in order to minimize the chance of a transfusion reaction. If you or a relative donates blood, this is often done in anticipation of surgery and is not appropriate for emergency situations. It takes  many days to process the donated blood. RISKS AND COMPLICATIONS Although transfusion therapy is very safe and saves many lives, the main dangers of transfusion include:  Getting an infectious disease. Developing a transfusion reaction. This is an allergic reaction to something in the blood you were given. Every precaution is taken to prevent this. The decision to have a blood transfusion has been considered carefully by your caregiver before blood is given. Blood is not given unless the benefits outweigh the risks. AFTER THE TRANSFUSION Right after receiving a blood transfusion, you will usually feel much better and more energetic. This is especially true if your red blood cells have gotten low (anemic). The transfusion raises the level of the red blood cells which carry oxygen, and this usually causes an energy increase. The nurse administering the transfusion will monitor you carefully for complications. HOME CARE INSTRUCTIONS  No special instructions are needed after a transfusion. You may find your energy is better. Speak with your caregiver about any limitations on activity for underlying diseases you may have. SEEK MEDICAL CARE IF:  Your condition is not improving after your transfusion. You develop redness or irritation at the intravenous (IV) site. SEEK IMMEDIATE MEDICAL CARE IF:  Any of the following symptoms occur over the next 12 hours: Shaking chills. You have a temperature by mouth above 102 F (38.9 C), not controlled by medicine. Chest, back, or muscle pain. People around you feel you are not acting correctly or are  confused. Shortness of breath or difficulty breathing. Dizziness and fainting. You get a rash or develop hives. You have a decrease in urine output. Your urine turns a dark color or changes to pink, red, or brown. Any of the following symptoms occur over the next 10 days: You have a temperature by mouth above 102 F (38.9 C), not controlled by medicine. Shortness of breath. Weakness after normal activity. The white part of the eye turns yellow (jaundice). You have a decrease in the amount of urine or are urinating less often. Your urine turns a dark color or changes to pink, red, or brown. Document Released: 01/24/2000 Document Revised: 04/20/2011 Document Reviewed: 09/12/2007 Exit[employer] Patient Information 2014 [place], [place].  _______________________________________________________________________  Incentive Spirometer  An incentive spirometer is a tool that can help keep your lungs clear and active. This tool measures how well you are filling your lungs with each breath. Taking long deep breaths may help reverse or decrease the chance of developing breathing (pulmonary) problems (especially infection) following: A long period of time when you are unable to move or be active. BEFORE THE PROCEDURE  If the spirometer includes an indicator to show your best effort, your nurse or respiratory therapist will set it to a desired goal. If possible, sit up straight or lean slightly forward. Try not to slouch. Hold the incentive spirometer in an upright position. INSTRUCTIONS FOR USE  Sit on the edge of your bed if possible, or sit up as far as you can in bed or on a chair. Hold the incentive spirometer in an upright position. Breathe out normally. Place the mouthpiece in your mouth and seal your lips tightly around it. Breathe in slowly and as deeply as possible, raising the piston or the ball toward the top of the column. Hold your breath for 3-5 seconds or for as long as possible. Allow  the piston or ball to fall to the bottom of the column. Remove the mouthpiece from your mouth and breathe out normally. Rest for a few  seconds and repeat Steps 1 through 7 at least 10 times every 1-2 hours when you are awake. Take your time and take a few normal breaths between deep breaths. The spirometer may include an indicator to show your best effort. Use the indicator as a goal to work toward during each repetition. After each set of 10 deep breaths, practice coughing to be sure your lungs are clear. If you have an incision (the cut made at the time of surgery), support your incision when coughing by placing a pillow or rolled up towels firmly against it. Once you are able to get out of bed, walk around indoors and cough well. You may stop using the incentive spirometer when instructed by your caregiver.  RISKS AND COMPLICATIONS Take your time so you do not get dizzy or light-headed. If you are in pain, you may need to take or ask for pain medication before doing incentive spirometry. It is harder to take a deep breath if you are having pain. AFTER USE Rest and breathe slowly and easily. It can be helpful to keep track of a log of your progress. Your caregiver can provide you with a simple table to help with this. If you are using the spirometer at home, follow these instructions: SEEK MEDICAL CARE IF:  You are having difficultly using the spirometer. You have trouble using the spirometer as often as instructed. Your pain medication is not giving enough relief while using the spirometer. You develop fever of 100.5 F (38.1 C) or higher. SEEK IMMEDIATE MEDICAL CARE IF:  You cough up bloody sputum that had not been present before. You develop fever of 102 F (38.9 C) or greater. You develop worsening pain at or near the incision site. MAKE SURE YOU:  Understand these instructions. Will watch your condition. Will get help right away if you are not doing well or get worse. Document  Released: 06/08/2006 Document Revised: 04/20/2011 Document Reviewed: 08/09/2006 [facility] Patient Information 2014 [place], [place].   ________________________________________________________________________

## 2023-10-14 ENCOUNTER — Other Ambulatory Visit: Payer: Self-pay

## 2023-10-14 ENCOUNTER — Encounter (HOSPITAL_COMMUNITY): Payer: Self-pay

## 2023-10-14 ENCOUNTER — Encounter (HOSPITAL_COMMUNITY)
Admission: RE | Admit: 2023-10-14 | Discharge: 2023-10-14 | Disposition: A | Discharge status: Home / Self Care | Source: Ambulatory Visit | Attending: Surgery | Admitting: Surgery

## 2023-10-14 VITALS — BP 133/86 | HR 72 | Temp 97.7°F | Resp 16 | Ht 60.0 in | Wt 121.0 lb

## 2023-10-14 DIAGNOSIS — Z01812 Encounter for preprocedural laboratory examination: Secondary | ICD-10-CM | POA: Diagnosis not present

## 2023-10-14 DIAGNOSIS — R739 Hyperglycemia, unspecified: Secondary | ICD-10-CM | POA: Diagnosis not present

## 2023-10-14 DIAGNOSIS — Z01818 Encounter for other preprocedural examination: Secondary | ICD-10-CM

## 2023-10-14 HISTORY — DX: Gastro-esophageal reflux disease without esophagitis: K21.9

## 2023-10-14 HISTORY — DX: Unspecified osteoarthritis, unspecified site: M19.90

## 2023-10-14 LAB — CBC
HCT: 40.6 % (ref 36.0–46.0)
Hemoglobin: 13.1 g/dL (ref 12.0–15.0)
MCH: 28.8 pg (ref 26.0–34.0)
MCHC: 32.3 g/dL (ref 30.0–36.0)
MCV: 89.2 fL (ref 80.0–100.0)
Platelets: 254 K/uL (ref 150–400)
RBC: 4.55 MIL/uL (ref 3.87–5.11)
RDW: 14.2 % (ref 11.5–15.5)
WBC: 8 K/uL (ref 4.0–10.5)
nRBC: 0 % (ref 0.0–0.2)

## 2023-10-14 LAB — HEMOGLOBIN A1C
Hgb A1c MFr Bld: 5 % (ref 4.8–5.6)
Mean Plasma Glucose: 96.8 mg/dL

## 2023-10-19 NOTE — Anesthesia Preprocedure Evaluation (Signed)
 Anesthesia Evaluation  Patient identified by MRN, date of birth, ID band Patient awake    Reviewed: Allergy & Precautions, NPO status , Patient's Chart, lab work & pertinent test results  History of Anesthesia Complications Negative for: history of anesthetic complications  Airway Mallampati: II  TM Distance: >3 FB Neck ROM: Full    Dental no notable dental hx.    Pulmonary neg pulmonary ROS   Pulmonary exam normal        Cardiovascular negative cardio ROS Normal cardiovascular exam     Neuro/Psych negative neurological ROS     GI/Hepatic Neg liver ROS,GERD  Controlled,,RECTAL PROLAPSE   Endo/Other  negative endocrine ROS    Renal/GU negative Renal ROS     Musculoskeletal  (+) Arthritis ,    Abdominal   Peds  Hematology negative hematology ROS (+)   Anesthesia Other Findings   Reproductive/Obstetrics                              Anesthesia Physical Anesthesia Plan  ASA: 2  Anesthesia Plan: General   Post-op Pain Management: Tylenol  PO (pre-op)*   Induction: Intravenous  PONV Risk Score and Plan: 3 and Treatment may vary due to age or medical condition, Ondansetron , Dexamethasone  and Midazolam   Airway Management Planned: Oral ETT  Additional Equipment: None  Intra-op Plan:   Post-operative Plan: Extubation in OR  Informed Consent: I have reviewed the patients History and Physical, chart, labs and discussed the procedure including the risks, benefits and alternatives for the proposed anesthesia with the patient or authorized representative who has indicated his/her understanding and acceptance.     Dental advisory given  Plan Discussed with: CRNA  Anesthesia Plan Comments:          Anesthesia Quick Evaluation

## 2023-10-20 ENCOUNTER — Inpatient Hospital Stay (HOSPITAL_COMMUNITY): Payer: Self-pay

## 2023-10-20 ENCOUNTER — Encounter (HOSPITAL_COMMUNITY): Payer: Self-pay

## 2023-10-20 ENCOUNTER — Encounter (HOSPITAL_COMMUNITY)
Admission: RE | Disposition: A | Discharge status: Home / Self Care | Payer: Self-pay | Source: Ambulatory Visit | Attending: Surgery

## 2023-10-20 ENCOUNTER — Inpatient Hospital Stay (HOSPITAL_COMMUNITY)
Admission: RE | Admit: 2023-10-20 | Discharge: 2023-10-22 | DRG: 330 | Disposition: A | Discharge status: Home / Self Care | Source: Ambulatory Visit | Attending: Surgery | Admitting: Surgery

## 2023-10-20 ENCOUNTER — Other Ambulatory Visit: Payer: Self-pay

## 2023-10-20 ENCOUNTER — Encounter (HOSPITAL_COMMUNITY): Payer: Self-pay | Admitting: Surgery

## 2023-10-20 DIAGNOSIS — Z83438 Family history of other disorder of lipoprotein metabolism and other lipidemia: Secondary | ICD-10-CM

## 2023-10-20 DIAGNOSIS — E875 Hyperkalemia: Secondary | ICD-10-CM | POA: Diagnosis not present

## 2023-10-20 DIAGNOSIS — Z8249 Family history of ischemic heart disease and other diseases of the circulatory system: Secondary | ICD-10-CM

## 2023-10-20 DIAGNOSIS — Z880 Allergy status to penicillin: Secondary | ICD-10-CM | POA: Diagnosis not present

## 2023-10-20 DIAGNOSIS — R32 Unspecified urinary incontinence: Secondary | ICD-10-CM | POA: Diagnosis present

## 2023-10-20 DIAGNOSIS — Q438 Other specified congenital malformations of intestine: Secondary | ICD-10-CM

## 2023-10-20 DIAGNOSIS — Z881 Allergy status to other antibiotic agents status: Secondary | ICD-10-CM

## 2023-10-20 DIAGNOSIS — K5909 Other constipation: Secondary | ICD-10-CM | POA: Diagnosis present

## 2023-10-20 DIAGNOSIS — H409 Unspecified glaucoma: Secondary | ICD-10-CM | POA: Diagnosis present

## 2023-10-20 DIAGNOSIS — Z79899 Other long term (current) drug therapy: Secondary | ICD-10-CM | POA: Diagnosis not present

## 2023-10-20 DIAGNOSIS — K623 Rectal prolapse: Principal | ICD-10-CM | POA: Diagnosis present

## 2023-10-20 HISTORY — PX: XI ROBOT ASSISTED RECTOPEXY: SHX6788

## 2023-10-20 LAB — TYPE AND SCREEN
ABO/RH(D): O POS
Antibody Screen: NEGATIVE

## 2023-10-20 LAB — ABO/RH: ABO/RH(D): O POS

## 2023-10-20 SURGERY — RECTOPEXY, ROBOT-ASSISTED
Anesthesia: General | Site: Abdomen

## 2023-10-20 MED ORDER — DEXAMETHASONE SODIUM PHOSPHATE 10 MG/ML IJ SOLN
INTRAMUSCULAR | Status: DC | PRN
Start: 2023-10-20 — End: 2023-10-20
  Administered 2023-10-20: 5 mg via INTRAVENOUS

## 2023-10-20 MED ORDER — PHENOL 1.4 % MT LIQD: 2.0000 | OROMUCOSAL | Status: DC | PRN

## 2023-10-20 MED ORDER — NEOMYCIN SULFATE 500 MG PO TABS
1000.0000 mg | ORAL_TABLET | ORAL | Status: DC
Start: 2023-10-20 — End: 2023-10-20

## 2023-10-20 MED ORDER — GABAPENTIN 100 MG PO CAPS
200.0000 mg | ORAL_CAPSULE | Freq: Every day | ORAL | Status: DC
  Administered 2023-10-20 – 2023-10-21 (×2): 200 mg via ORAL
  Filled 2023-10-20 (×2): qty 2

## 2023-10-20 MED ORDER — CALCIUM POLYCARBOPHIL 625 MG PO TABS
625.0000 mg | ORAL_TABLET | Freq: Two times a day (BID) | ORAL | Status: DC
  Administered 2023-10-20 – 2023-10-22 (×4): 625 mg via ORAL
  Filled 2023-10-20 (×4): qty 1

## 2023-10-20 MED ORDER — SODIUM CHLORIDE 0.9% FLUSH
3.0000 mL | Freq: Two times a day (BID) | INTRAVENOUS | Status: DC
  Administered 2023-10-20 – 2023-10-21 (×4): 3 mL via INTRAVENOUS

## 2023-10-20 MED ORDER — CHLORHEXIDINE GLUCONATE 0.12 % MT SOLN
15.0000 mL | Freq: Once | OROMUCOSAL | Status: AC
  Administered 2023-10-20: 15 mL via OROMUCOSAL

## 2023-10-20 MED ORDER — SODIUM CHLORIDE 0.9% FLUSH: 3.0000 mL | INTRAVENOUS | Status: DC | PRN

## 2023-10-20 MED ORDER — SUGAMMADEX SODIUM 200 MG/2ML IV SOLN
INTRAVENOUS | Status: DC | PRN
Start: 2023-10-20 — End: 2023-10-20
  Administered 2023-10-20: 120 mg via INTRAVENOUS

## 2023-10-20 MED ORDER — METOPROLOL TARTRATE 5 MG/5ML IV SOLN: 5.0000 mg | Freq: Four times a day (QID) | INTRAVENOUS | Status: DC | PRN

## 2023-10-20 MED ORDER — ENSURE PRE-SURGERY PO LIQD: 592.0000 mL | Freq: Once | ORAL | Status: DC

## 2023-10-20 MED ORDER — POLYETHYLENE GLYCOL 3350 17 GM/SCOOP PO POWD: 238.0000 g | Freq: Once | ORAL | Status: DC

## 2023-10-20 MED ORDER — LIDOCAINE HCL (CARDIAC) PF 100 MG/5ML IV SOSY
PREFILLED_SYRINGE | INTRAVENOUS | Status: DC | PRN
  Administered 2023-10-20: 60 mg via INTRAVENOUS

## 2023-10-20 MED ORDER — HYDRALAZINE HCL 20 MG/ML IJ SOLN: 10.0000 mg | INTRAMUSCULAR | Status: DC | PRN

## 2023-10-20 MED ORDER — SODIUM CHLORIDE 0.9 % IV SOLN: 250.0000 mL | INTRAVENOUS | Status: DC | PRN

## 2023-10-20 MED ORDER — LACTATED RINGERS IV SOLN
INTRAVENOUS | Status: DC
Start: 2023-10-20 — End: 2023-10-20

## 2023-10-20 MED ORDER — POLYVINYL ALCOHOL 1.4 % OP SOLN
1.0000 [drp] | Freq: Two times a day (BID) | OPHTHALMIC | Status: DC
  Administered 2023-10-21 – 2023-10-22 (×2): 1 [drp] via OPHTHALMIC
  Filled 2023-10-20: qty 15

## 2023-10-20 MED ORDER — ONDANSETRON HCL 4 MG/2ML IJ SOLN
INTRAMUSCULAR | Status: DC | PRN
Start: 2023-10-20 — End: 2023-10-20
  Administered 2023-10-20: 4 mg via INTRAVENOUS

## 2023-10-20 MED ORDER — PHENYLEPHRINE 80 MCG/ML (10ML) SYRINGE FOR IV PUSH (FOR BLOOD PRESSURE SUPPORT)
PREFILLED_SYRINGE | INTRAVENOUS | Status: DC | PRN
  Administered 2023-10-20 (×2): 80 ug via INTRAVENOUS

## 2023-10-20 MED ORDER — ALVIMOPAN 12 MG PO CAPS
12.0000 mg | ORAL_CAPSULE | Freq: Two times a day (BID) | ORAL | Status: DC
  Administered 2023-10-21: 12 mg via ORAL
  Filled 2023-10-20: qty 1

## 2023-10-20 MED ORDER — HYDROMORPHONE HCL 1 MG/ML IJ SOLN
0.5000 mg | INTRAMUSCULAR | Status: DC | PRN
  Administered 2023-10-21: 1 mg via INTRAVENOUS
  Filled 2023-10-20: qty 1

## 2023-10-20 MED ORDER — ONDANSETRON HCL 4 MG PO TABS
4.0000 mg | ORAL_TABLET | Freq: Four times a day (QID) | ORAL | Status: DC | PRN
  Filled 2023-10-20: qty 1

## 2023-10-20 MED ORDER — ORAL CARE MOUTH RINSE: 15.0000 mL | Freq: Once | OROMUCOSAL | Status: AC

## 2023-10-20 MED ORDER — DIPHENHYDRAMINE HCL 12.5 MG/5ML PO ELIX: 12.5000 mg | ORAL_SOLUTION | Freq: Four times a day (QID) | ORAL | Status: DC | PRN

## 2023-10-20 MED ORDER — SIMETHICONE 80 MG PO CHEW
40.0000 mg | CHEWABLE_TABLET | Freq: Four times a day (QID) | ORAL | Status: DC | PRN
  Administered 2023-10-21 – 2023-10-22 (×4): 40 mg via ORAL
  Filled 2023-10-20 (×4): qty 1

## 2023-10-20 MED ORDER — DIPHENHYDRAMINE HCL 50 MG/ML IJ SOLN: 12.5000 mg | Freq: Four times a day (QID) | INTRAMUSCULAR | Status: DC | PRN

## 2023-10-20 MED ORDER — ENOXAPARIN SODIUM 40 MG/0.4ML IJ SOSY
40.0000 mg | PREFILLED_SYRINGE | INTRAMUSCULAR | Status: DC
  Administered 2023-10-21 – 2023-10-22 (×2): 40 mg via SUBCUTANEOUS
  Filled 2023-10-20 (×2): qty 0.4

## 2023-10-20 MED ORDER — PROCHLORPERAZINE MALEATE 10 MG PO TABS: 10.0000 mg | ORAL_TABLET | Freq: Four times a day (QID) | ORAL | Status: DC | PRN

## 2023-10-20 MED ORDER — PROPOFOL 10 MG/ML IV BOLUS
INTRAVENOUS | Status: DC | PRN
  Administered 2023-10-20: 120 mg via INTRAVENOUS

## 2023-10-20 MED ORDER — ENSURE PRE-SURGERY PO LIQD: 296.0000 mL | Freq: Once | ORAL | Status: DC

## 2023-10-20 MED ORDER — MENTHOL 3 MG MT LOZG: 1.0000 | LOZENGE | OROMUCOSAL | Status: DC | PRN

## 2023-10-20 MED ORDER — SODIUM CHLORIDE 0.9 % IV SOLN: Freq: Three times a day (TID) | INTRAVENOUS | Status: AC | PRN

## 2023-10-20 MED ORDER — MAGIC MOUTHWASH: 15.0000 mL | Freq: Four times a day (QID) | ORAL | Status: DC | PRN

## 2023-10-20 MED ORDER — ALVIMOPAN 12 MG PO CAPS
12.0000 mg | ORAL_CAPSULE | ORAL | Status: AC
  Administered 2023-10-20: 12 mg via ORAL
  Filled 2023-10-20: qty 1

## 2023-10-20 MED ORDER — BUPIVACAINE-EPINEPHRINE (PF) 0.25% -1:200000 IJ SOLN
INTRAMUSCULAR | Status: AC
Start: 2023-10-20 — End: 2023-10-20
  Filled 2023-10-20: qty 60

## 2023-10-20 MED ORDER — ACETAMINOPHEN 500 MG PO TABS
1000.0000 mg | ORAL_TABLET | Freq: Once | ORAL | Status: AC
  Administered 2023-10-20: 1000 mg via ORAL
  Filled 2023-10-20: qty 2

## 2023-10-20 MED ORDER — ALUM & MAG HYDROXIDE-SIMETH 200-200-20 MG/5ML PO SUSP: 30.0000 mL | Freq: Four times a day (QID) | ORAL | Status: DC | PRN

## 2023-10-20 MED ORDER — PROPOFOL 10 MG/ML IV BOLUS
INTRAVENOUS | Status: AC
Start: 2023-10-20 — End: 2023-10-20
  Filled 2023-10-20: qty 20

## 2023-10-20 MED ORDER — BISACODYL 5 MG PO TBEC: 20.0000 mg | DELAYED_RELEASE_TABLET | Freq: Once | ORAL | Status: DC

## 2023-10-20 MED ORDER — TRAMADOL HCL 50 MG PO TABS
50.0000 mg | ORAL_TABLET | Freq: Four times a day (QID) | ORAL | Status: DC | PRN
  Administered 2023-10-20 – 2023-10-22 (×6): 50 mg via ORAL
  Filled 2023-10-20 (×7): qty 1

## 2023-10-20 MED ORDER — CARBOXYMETHYLCELLUL-GLYCERIN 0.5-0.9 % OP SOLN: 1.0000 [drp] | Freq: Two times a day (BID) | OPHTHALMIC | Status: DC

## 2023-10-20 MED ORDER — METRONIDAZOLE 500 MG PO TABS
1000.0000 mg | ORAL_TABLET | ORAL | Status: DC
Start: 2023-10-20 — End: 2023-10-20

## 2023-10-20 MED ORDER — ONDANSETRON HCL 4 MG/2ML IJ SOLN
INTRAMUSCULAR | Status: AC
  Filled 2023-10-20: qty 2

## 2023-10-20 MED ORDER — ROCURONIUM BROMIDE 100 MG/10ML IV SOLN
INTRAVENOUS | Status: DC | PRN
  Administered 2023-10-20: 20 mg via INTRAVENOUS
  Administered 2023-10-20: 50 mg via INTRAVENOUS

## 2023-10-20 MED ORDER — ENOXAPARIN SODIUM 40 MG/0.4ML IJ SOSY
40.0000 mg | PREFILLED_SYRINGE | Freq: Once | INTRAMUSCULAR | Status: AC
  Administered 2023-10-20: 40 mg via SUBCUTANEOUS
  Filled 2023-10-20: qty 0.4

## 2023-10-20 MED ORDER — ACETAMINOPHEN 500 MG PO TABS
1000.0000 mg | ORAL_TABLET | Freq: Four times a day (QID) | ORAL | Status: DC
  Administered 2023-10-20 – 2023-10-22 (×8): 1000 mg via ORAL
  Filled 2023-10-20 (×8): qty 2

## 2023-10-20 MED ORDER — ONDANSETRON HCL 4 MG/2ML IJ SOLN
4.0000 mg | Freq: Four times a day (QID) | INTRAMUSCULAR | Status: DC | PRN
  Administered 2023-10-21: 4 mg via INTRAVENOUS

## 2023-10-20 MED ORDER — VITAMIN D 25 MCG (1000 UNIT) PO TABS
1000.0000 [IU] | ORAL_TABLET | Freq: Every day | ORAL | Status: DC
  Administered 2023-10-21 – 2023-10-22 (×2): 1000 [IU] via ORAL
  Filled 2023-10-20 (×2): qty 1

## 2023-10-20 MED ORDER — SALINE SPRAY 0.65 % NA SOLN: 1.0000 | Freq: Four times a day (QID) | NASAL | Status: DC | PRN

## 2023-10-20 MED ORDER — FENTANYL CITRATE (PF) 100 MCG/2ML IJ SOLN
INTRAMUSCULAR | Status: DC | PRN
  Administered 2023-10-20 (×2): 50 ug via INTRAVENOUS

## 2023-10-20 MED ORDER — ZOLPIDEM TARTRATE 5 MG PO TABS
2.5000 mg | ORAL_TABLET | Freq: Every evening | ORAL | Status: DC | PRN
  Filled 2023-10-20: qty 1

## 2023-10-20 MED ORDER — BUPIVACAINE-EPINEPHRINE (PF) 0.25% -1:200000 IJ SOLN
INTRAMUSCULAR | Status: DC | PRN
  Administered 2023-10-20: 60 mL

## 2023-10-20 MED ORDER — CALCIUM CARBONATE 1250 (500 CA) MG PO TABS
1.0000 | ORAL_TABLET | Freq: Every day | ORAL | Status: DC
  Filled 2023-10-20 (×2): qty 1

## 2023-10-20 MED ORDER — GABAPENTIN 100 MG PO CAPS
200.0000 mg | ORAL_CAPSULE | ORAL | Status: AC
  Administered 2023-10-20: 200 mg via ORAL
  Filled 2023-10-20: qty 2

## 2023-10-20 MED ORDER — HYDROMORPHONE HCL 1 MG/ML IJ SOLN
INTRAMUSCULAR | Status: AC
  Filled 2023-10-20: qty 1

## 2023-10-20 MED ORDER — BUPIVACAINE LIPOSOME 1.3 % IJ SUSP
20.0000 mL | Freq: Once | INTRAMUSCULAR | Status: DC
Start: 2023-10-20 — End: 2023-10-20

## 2023-10-20 MED ORDER — KCL IN DEXTROSE-NACL 20-5-0.45 MEQ/L-%-% IV SOLN
INTRAVENOUS | Status: DC
  Filled 2023-10-20: qty 1000

## 2023-10-20 MED ORDER — LATANOPROST 0.005 % OP SOLN
1.0000 [drp] | Freq: Every day | OPHTHALMIC | Status: DC
  Administered 2023-10-20 – 2023-10-21 (×2): 1 [drp] via OPHTHALMIC
  Filled 2023-10-20: qty 2.5

## 2023-10-20 MED ORDER — LIDOCAINE HCL (PF) 2 % IJ SOLN
INTRAMUSCULAR | Status: AC
Start: 2023-10-20 — End: 2023-10-20
  Filled 2023-10-20: qty 5

## 2023-10-20 MED ORDER — ENSURE SURGERY PO LIQD
237.0000 mL | Freq: Two times a day (BID) | ORAL | Status: DC
  Administered 2023-10-20 – 2023-10-22 (×3): 237 mL via ORAL

## 2023-10-20 MED ORDER — PROCHLORPERAZINE EDISYLATE 10 MG/2ML IJ SOLN: 5.0000 mg | Freq: Four times a day (QID) | INTRAMUSCULAR | Status: DC | PRN

## 2023-10-20 MED ORDER — DEXAMETHASONE SODIUM PHOSPHATE 10 MG/ML IJ SOLN
INTRAMUSCULAR | Status: AC
  Filled 2023-10-20: qty 1

## 2023-10-20 MED ORDER — FENTANYL CITRATE (PF) 100 MCG/2ML IJ SOLN
INTRAMUSCULAR | Status: AC
  Filled 2023-10-20: qty 2

## 2023-10-20 MED ORDER — NAPHAZOLINE-GLYCERIN 0.012-0.25 % OP SOLN
1.0000 [drp] | Freq: Four times a day (QID) | OPHTHALMIC | Status: DC | PRN
Start: 2023-10-20 — End: 2023-10-22

## 2023-10-20 MED ORDER — SUGAMMADEX SODIUM 200 MG/2ML IV SOLN
INTRAVENOUS | Status: AC
Start: 2023-10-20 — End: 2023-10-20
  Filled 2023-10-20: qty 2

## 2023-10-20 MED ORDER — ACETAMINOPHEN 500 MG PO TABS: 1000.0000 mg | ORAL_TABLET | ORAL | Status: DC

## 2023-10-20 MED ORDER — CALCIUM CARBONATE 1500 (600 CA) MG PO TABS
600.0000 mg | ORAL_TABLET | Freq: Every morning | ORAL | Status: DC
Start: 2023-10-21 — End: 2023-10-20

## 2023-10-20 MED ORDER — MIDAZOLAM HCL 5 MG/5ML IJ SOLN
INTRAMUSCULAR | Status: DC | PRN
  Administered 2023-10-20 (×2): 1 mg via INTRAVENOUS

## 2023-10-20 MED ORDER — HYDROMORPHONE HCL 1 MG/ML IJ SOLN
0.2500 mg | INTRAMUSCULAR | Status: DC | PRN
  Administered 2023-10-20 (×2): 0.5 mg via INTRAVENOUS

## 2023-10-20 MED ORDER — 0.9 % SODIUM CHLORIDE (POUR BTL) OPTIME
TOPICAL | Status: DC | PRN
  Administered 2023-10-20: 1000 mL

## 2023-10-20 MED ORDER — SODIUM CHLORIDE 0.9 % IV SOLN
2.0000 g | Freq: Two times a day (BID) | INTRAVENOUS | Status: AC
Start: 2023-10-20 — End: 2023-10-20
  Administered 2023-10-20: 2 g via INTRAVENOUS
  Filled 2023-10-20: qty 2

## 2023-10-20 MED ORDER — DROPERIDOL 2.5 MG/ML IJ SOLN
0.6250 mg | Freq: Once | INTRAMUSCULAR | Status: DC | PRN
Start: 2023-10-20 — End: 2023-10-20

## 2023-10-20 MED ORDER — EPHEDRINE SULFATE-NACL 50-0.9 MG/10ML-% IV SOSY
PREFILLED_SYRINGE | INTRAVENOUS | Status: DC | PRN
  Administered 2023-10-20 (×4): 5 mg via INTRAVENOUS

## 2023-10-20 MED ORDER — SODIUM CHLORIDE 0.9 % IV SOLN
1.0000 g | INTRAVENOUS | Status: AC
  Administered 2023-10-20: 1 g via INTRAVENOUS
  Filled 2023-10-20: qty 1000

## 2023-10-20 MED ORDER — MIDAZOLAM HCL 2 MG/2ML IJ SOLN
INTRAMUSCULAR | Status: AC
  Filled 2023-10-20: qty 2

## 2023-10-20 SURGICAL SUPPLY — 85 items
BAG COUNTER SPONGE SURGICOUNT (BAG) ×2 IMPLANT
BLADE EXTENDED COATED 6.5IN (ELECTRODE) IMPLANT
CANNULA REDUCER 12-8 DVNC XI (CANNULA) IMPLANT
CELLS DAT CNTRL 66122 CELL SVR (MISCELLANEOUS) IMPLANT
CHLORAPREP W/TINT 26 (MISCELLANEOUS) IMPLANT
CLIP APPLIE 5 13 M/L LIGAMAX5 (MISCELLANEOUS) IMPLANT
CLIP APPLIE ROT 10 11.4 M/L (STAPLE) IMPLANT
COVER SURGICAL LIGHT HANDLE (MISCELLANEOUS) ×4 IMPLANT
COVER TIP SHEARS 8 DVNC (MISCELLANEOUS) ×2 IMPLANT
DEFOGGER SCOPE WARM SEASHARP (MISCELLANEOUS) ×2 IMPLANT
DEVICE TROCAR PUNCTURE CLOSURE (ENDOMECHANICALS) IMPLANT
DRAIN CHANNEL 19F RND (DRAIN) ×1 IMPLANT
DRAPE ARM DVNC X/XI (DISPOSABLE) ×8 IMPLANT
DRAPE COLUMN DVNC XI (DISPOSABLE) ×2 IMPLANT
DRAPE CV SPLIT W-CLR ANES SCRN (DRAPES) ×2 IMPLANT
DRAPE PERI GROIN 82X75IN TIB (DRAPES) ×2 IMPLANT
DRAPE SURG IRRIG POUCH 19X23 (DRAPES) ×2 IMPLANT
DRIVER NDL LRG 8 DVNC XI (INSTRUMENTS) ×1 IMPLANT
DRIVER NDLE LRG 8 DVNC XI (INSTRUMENTS) ×2 IMPLANT
DRSG OPSITE POSTOP 4X10 (GAUZE/BANDAGES/DRESSINGS) IMPLANT
DRSG OPSITE POSTOP 4X6 (GAUZE/BANDAGES/DRESSINGS) IMPLANT
DRSG OPSITE POSTOP 4X8 (GAUZE/BANDAGES/DRESSINGS) IMPLANT
DRSG TEGADERM 2-3/8X2-3/4 SM (GAUZE/BANDAGES/DRESSINGS) ×10 IMPLANT
DRSG TEGADERM 4X4.75 (GAUZE/BANDAGES/DRESSINGS) IMPLANT
ELECT PENCIL ROCKER SW 15FT (MISCELLANEOUS) ×2 IMPLANT
ELECT REM PT RETURN 15FT ADLT (MISCELLANEOUS) ×2 IMPLANT
ENDOLOOP SUT PDS II 0 18 (SUTURE) IMPLANT
EVACUATOR SILICONE 100CC (DRAIN) ×1 IMPLANT
GAUZE SPONGE 2X2 8PLY STRL LF (GAUZE/BANDAGES/DRESSINGS) ×2 IMPLANT
GLOVE ECLIPSE 8.0 STRL XLNG CF (GLOVE) ×6 IMPLANT
GLOVE INDICATOR 8.0 STRL GRN (GLOVE) ×6 IMPLANT
GOWN SRG XL LVL 4 BRTHBL STRL (GOWNS) ×2 IMPLANT
GOWN STRL REUS W/ TWL XL LVL3 (GOWN DISPOSABLE) ×8 IMPLANT
GRASPER SUT TROCAR 14GX15 (MISCELLANEOUS) IMPLANT
GRASPER TIP-UP FEN DVNC XI (INSTRUMENTS) ×2 IMPLANT
HOLDER FOLEY CATH W/STRAP (MISCELLANEOUS) ×2 IMPLANT
IRRIGATION SUCT STRKRFLW 2 WTP (MISCELLANEOUS) ×2 IMPLANT
KIT PROCEDURE DVNC SI (MISCELLANEOUS) IMPLANT
KIT SIGMOIDOSCOPE (SET/KITS/TRAYS/PACK) IMPLANT
KIT TURNOVER KIT A (KITS) ×2 IMPLANT
NDL INSUFFLATION 14GA 120MM (NEEDLE) ×1 IMPLANT
NDL SPNL 20GX3.5 QUINCKE YW (NEEDLE) ×1 IMPLANT
NEEDLE INSUFFLATION 14GA 120MM (NEEDLE) ×2 IMPLANT
NEEDLE SPNL 20GX3.5 QUINCKE YW (NEEDLE) ×2 IMPLANT
PACK COLON (CUSTOM PROCEDURE TRAY) ×1 IMPLANT
PAD POSITIONING PINK XL (MISCELLANEOUS) ×2 IMPLANT
PROTECTOR NERVE ULNAR (MISCELLANEOUS) ×4 IMPLANT
RELOAD STAPLE 45 3.5 BLU DVNC (STAPLE) IMPLANT
RELOAD STAPLE 45 4.3 GRN DVNC (STAPLE) IMPLANT
RELOAD STAPLE 60 3.5 BLU DVNC (STAPLE) IMPLANT
RELOAD STAPLE 60 4.3 GRN DVNC (STAPLE) IMPLANT
RETRACTOR WND ALEXIS 18 MED (MISCELLANEOUS) IMPLANT
SCISSORS LAP 5X35 DISP (ENDOMECHANICALS) ×2 IMPLANT
SCISSORS MNPLR CVD DVNC XI (INSTRUMENTS) ×2 IMPLANT
SEAL UNIV 5-12 XI (MISCELLANEOUS) ×8 IMPLANT
SEALER VESSEL EXT DVNC XI (MISCELLANEOUS) ×2 IMPLANT
SOLUTION ELECTROSURG ANTI STCK (MISCELLANEOUS) ×2 IMPLANT
SPIKE FLUID TRANSFER (MISCELLANEOUS) ×2 IMPLANT
STAPLER 45 SUREFORM DVNC (STAPLE) IMPLANT
STAPLER 60 SUREFORM DVNC (STAPLE) IMPLANT
STAPLER ECHELON POWER CIR 29 (STAPLE) IMPLANT
STAPLER ECHELON POWER CIR 31 (STAPLE) IMPLANT
STOPCOCK 4 WAY LG BORE MALE ST (IV SETS) ×4 IMPLANT
SURGILUBE 2OZ TUBE FLIPTOP (MISCELLANEOUS) IMPLANT
SUT MNCRL AB 4-0 PS2 18 (SUTURE) ×2 IMPLANT
SUT PDS AB 1 CT1 27 (SUTURE) ×4 IMPLANT
SUT PROLENE 0 CT 2 (SUTURE) ×2 IMPLANT
SUT PROLENE 2 0 KS (SUTURE) IMPLANT
SUT PROLENE 2 0 SH DA (SUTURE) ×1 IMPLANT
SUT SILK 2 0 SH CR/8 (SUTURE) IMPLANT
SUT SILK 3 0 SH CR/8 (SUTURE) IMPLANT
SUT VIC AB 2-0 SH 18 (SUTURE) ×2 IMPLANT
SUT VIC AB 3-0 SH 18 (SUTURE) IMPLANT
SUT VIC AB 3-0 SH 27XBRD (SUTURE) IMPLANT
SUT VICRYL 0 UR6 27IN ABS (SUTURE) ×1 IMPLANT
SUT VLOC 180 2-0 9IN GS21 (SUTURE) IMPLANT
SUTURE V-LC BRB 180 2/0GR6GS22 (SUTURE) ×2 IMPLANT
SYR 20ML ECCENTRIC (SYRINGE) ×2 IMPLANT
SYSTEM LAPSCP GELPORT 120MM (MISCELLANEOUS) IMPLANT
SYSTEM WOUND ALEXIS 18CM MED (MISCELLANEOUS) ×2 IMPLANT
TAPE UMBILICAL 1/8 X36 TWILL (MISCELLANEOUS) ×2 IMPLANT
TRAY FOLEY MTR SLVR 16FR STAT (SET/KITS/TRAYS/PACK) ×2 IMPLANT
TROCAR ADV FIXATION 5X100MM (TROCAR) ×2 IMPLANT
TUBING CONNECTING 10 (TUBING) ×4 IMPLANT
TUBING INSUFFLATION 10FT LAP (TUBING) ×2 IMPLANT

## 2023-10-20 NOTE — Anesthesia Postprocedure Evaluation (Signed)
 Anesthesia Post Note  Patient: Kathy Bennett  Procedure(s) Performed: RECTOPEXY, ROBOT-ASSISTED (Abdomen)     Patient location during evaluation: PACU Anesthesia Type: General Level of consciousness: awake and alert Pain management: pain level controlled Vital Signs Assessment: post-procedure vital signs reviewed and stable Respiratory status: spontaneous breathing, nonlabored ventilation and respiratory function stable Cardiovascular status: blood pressure returned to baseline Postop Assessment: no apparent nausea or vomiting Anesthetic complications: no   No notable events documented.  Last Vitals:  Vitals:   10/20/23 1215 10/20/23 1239  BP: 113/65 119/70  Pulse: (!) 56 60  Resp: 13 15  Temp:  36.6 C  SpO2: 98% 100%    Last Pain:  Vitals:   10/20/23 1239  TempSrc: Oral  PainSc: 6                  Vertell Row

## 2023-10-20 NOTE — Plan of Care (Signed)
  Problem: Education: Goal: Understanding of discharge needs will improve Outcome: Progressing Goal: Verbalization of understanding of the causes of altered bowel function will improve Outcome: Progressing   Problem: Activity: Goal: Ability to tolerate increased activity will improve Outcome: Progressing

## 2023-10-20 NOTE — Interval H&P Note (Signed)
 History and Physical Interval Note:  10/20/2023 7:35 AM  Kathy Bennett  has presented today for surgery, with the diagnosis of RECTAL PROLAPSE.  The various methods of treatment have been discussed with the patient and family. After consideration of risks, benefits and other options for treatment, the patient has consented to  Procedure(s) with comments: RECTOPEXY, ROBOT-ASSISTED (N/A) SIGMOIDOSCOPY, FLEXIBLE (N/A) COLECTOMY, SIGMOID, ROBOT-ASSISTED (N/A) - POSSIBLE RESECTION OF COLON RECTOSIGMOID as a surgical intervention.  The patient's history has been reviewed, patient examined, no change in status, stable for surgery.  I have reviewed the patient's chart and labs.  Questions were answered to the patient's satisfaction.    I have re-reviewed the the patient's records, history, medications, and allergies.  I have re-examined the patient.  I again discussed intraoperative plans and goals of post-operative recovery.  The patient agrees to proceed.  Kathy Bennett  Nov 16, 1955 [id number]  Patient Care Team: Rosamond Leta NOVAK, MD as PCP - General (Internal Medicine)  Patient Active Problem List   Diagnosis Date Noted   GERD 06/08/2009   DYSPEPSIA 05/21/2009   DYSPHAGIA 03/27/2009    Past Medical History:  Diagnosis Date   Arthritis    lower back   Chronic constipation    GERD (gastroesophageal reflux disease)    no meds   Rectal prolapse     Past Surgical History:  Procedure Laterality Date   BIOPSY  01/21/2021   Procedure: BIOPSY;  Surgeon: Cindie Carlin POUR, DO;  Location: [facility];  Service: Endoscopy;;   COLONOSCOPY     COLONOSCOPY WITH PROPOFOL  N/A 01/21/2021   Procedure: COLONOSCOPY WITH PROPOFOL ;  Surgeon: Cindie Carlin POUR, DO;  Location: [facility];  Service: Endoscopy;  Laterality: N/A;  9:45 / ASA I/ II   EYE SURGERY      Social History   Socioeconomic History   Marital status: Married    Spouse name: Not on file   Number of children: Not on file    Years of education: Not on file   Highest education level: Not on file  Occupational History   Not on file  Tobacco Use   Smoking status: Never    Passive exposure: Past   Smokeless tobacco: Never  Vaping Use   Vaping status: Never Used  Substance and Sexual Activity   Alcohol  use: Yes    Comment: 1-2 x a month   Drug use: Never   Sexual activity: Not Currently  Other Topics Concern   Not on file  Social History Narrative   Not on file   Social Drivers of Health   Financial Resource Strain: Not on file  Food Insecurity: Not on file  Transportation Needs: Not on file  Physical Activity: Not on file  Stress: Not on file  Social Connections: Not on file  Intimate Partner Violence: Not on file    History reviewed. No pertinent family history.  Medications Prior to Admission  Medication Sig Dispense Refill Last Dose/Taking   calcium  carbonate (OSCAL) 1500 (600 Ca) MG TABS tablet Take 600 mg by mouth in the morning.   10/16/2023   carboxymethylcellul-glycerin  (REFRESH RELIEVA) 0.5-0.9 % ophthalmic solution Place 1-2 drops into both eyes in the morning and at bedtime.   Past Week   cholecalciferol  (VITAMIN D3) 25 MCG (1000 UNIT) tablet Take 1,000 Units by mouth in the morning.   10/18/2023   latanoprost  (XALATAN ) 0.005 % ophthalmic solution Place 1 drop into both eyes at bedtime.   10/18/2023   polyethylene  glycol (MIRALAX  / GLYCOLAX ) 17 g packet Take 17 g by mouth 3 (three) times a week.   10/19/2023   zolpidem  (AMBIEN ) 5 MG tablet Take 2.5 mg by mouth at bedtime as needed for sleep.   Past Month    Current Facility-Administered Medications  Medication Dose Route Frequency Provider Last Rate Last Admin   bupivacaine  liposome (EXPAREL ) 1.3 % injection 266 mg  20 mL Infiltration Once Bennett Standing, MD       enoxaparin  (LOVENOX ) injection 40 mg  40 mg Subcutaneous Once Bennett Standing, MD       ertapenem  (INVANZ ) 1 g in sodium chloride  0.9 % 100 mL IVPB  1 g Intravenous On Call to OR  Bennett Standing, MD       lactated ringers  infusion   Intravenous Continuous Peggye Delon Brunswick, MD         Allergies  Allergen Reactions   Penicillins Hives   Azithromycin Hives    BP 117/73   Pulse 68   Temp 98.3 F (36.8 C) (Oral)   Resp 16   SpO2 100%   Labs: No results found for this or any previous visit (from the past 48 hours).  Imaging / Studies: No results found.   Briant KYM Bennett, M.D., F.A.C.S. [facility] [address] [place], [place] [address] [phone] Main / Paging  10/20/2023 7:35 AM    Standing JAYSON Bennett

## 2023-10-20 NOTE — H&P (Signed)
 10/20/2023   PATIENT NAME: Kathy Bennett MRN: [id number] DOB: [birth year] PHYSICIANS:  REFERRING PHYSICIAN: Cindie Dunnings, DO  CARE TEAM:  Patient Care Team: Rosamond Leta NOVAK, MD as PCP - General (Internal Medicine) Cindie Dunnings, DO (Gastroenterology)  CONSULTING PROVIDER: ELSPETH JUDAH SCHULTZE, MD  SUBJECTIVE   Chief Complaint: New Consultation (rectal prolaspe and proctitis. )   Kathy Bennett is a 68 y.o. female  who is seen today as an office consultation  at the request of DrRONITA Cindie  for evaluation of rectal prolapse.  History of Present Illness:  Pleasant active woman. Exercises does yoga. Has noticed intermittent prolapsing tissue at the anus for the past few years. Struggled with a lot of chronic constipation. Discussed with primary care. Followed by gastrology. Had a colonoscopy a few years ago which showed some irritated distal rectal mucosa with some changes concerning but biopsies more consistent with chronic prolapse. She notes that large volume of tissue will pop out. She occasionally has issues with incontinence of flatus but not really stools. She takes about a half a dose to three quarters of a dose of MiraLAX  every other day and that seems to help override her constipation without straining. She has had a couple episodes of urinary tract infection. She been doing Kegel exercises. Despite correcting her constipation with better pelvic floor exercises she still having tissue popping out and prolapsing. Discussed with gastroenterology and wish to consider surgical evaluation.  Patient does not smoke. No diabetes. Never had any abdominal surgery. No anorectal interventions. Has some mild urinary incontinence with coughing and sneezing but nothing too bad. It is getting the point that if she laughs coughs or sneezes she will feel rectal tissue popping out. Frustrating embarrassing. Gets mucus drainage and staining as well.   Medical History:  Past Medical History:   Diagnosis Date  Arthritis  Glaucoma (increased eye pressure)   Patient Active Problem List  Diagnosis  Rectal prolapse  Chronic constipation  Decreased anal sphincter tone   History reviewed. No pertinent surgical history.   Allergies  Allergen Reactions  Penicillins Hives  Azithromycin Hives and Other (See Comments)   Current Outpatient Medications on File Prior to Visit  Medication Sig Dispense Refill  calcium  carbonate (CALCIUM  500 ORAL) Take by mouth  cholecalciferol  (VITAMIN D3) 2,000 unit tablet Take 2,000 Int'l Units by mouth  latanoprost  (XALATAN ) 0.005 % ophthalmic solution 1 drop at bedtime   No current facility-administered medications on file prior to visit.   Family History  Problem Relation Age of Onset  High blood pressure (Hypertension) Mother  Coronary Artery Disease (Blocked arteries around heart) Father  Hyperlipidemia (Elevated cholesterol) Father    Social History   Tobacco Use  Smoking Status Never  Smokeless Tobacco Never    Social History   Socioeconomic History  Marital status: Married  Tobacco Use  Smoking status: Never  Smokeless tobacco: Never  Vaping Use  Vaping status: Never Used  Substance and Sexual Activity  Alcohol  use: Yes  Alcohol /week: 1.0 - 2.0 standard drink of alcohol   Types: 1 - 2 Standard drinks or equivalent per week  Drug use: Never   Social Drivers of Health   Housing Stability: Unknown (08/31/2023)  Housing Stability Vital Sign  Homeless in the Last Year: No   ############################################################  Review of Systems: A complete review of systems (ROS) was obtained from the patient.  We have reviewed this information and discussed as appropriate with the patient.  See HPI as well for other pertinent ROS.  Constitutional: No fevers, chills, sweats. Weight stable Eyes: No vision changes, No discharge HENT: No sore throats, nasal drainage Lymph: No neck swelling, No bruising  easily Pulmonary: No cough, productive sputum CV: No orthopnea, PND . No exertional chest/neck/shoulder/arm pain. Patient can walk 2-3 miles without difficulty.   GI: No personal nor family history of GI/colon cancer, inflammatory bowel disease, irritable bowel syndrome, allergy such as Celiac Sprue, dietary/dairy problems, colitis, ulcers nor gastritis. No recent sick contacts/gastroenteritis. No travel outside the country. No changes in diet.  Renal: No UTIs, No hematuria Genital: No drainage, bleeding, masses Musculoskeletal: No severe joint pain. Good ROM major joints Skin: No sores or lesions Heme/Lymph: No easy bleeding. No swollen lymph nodes Neuro: No active seizures. No facial droop Psych: No hallucinations. No agitation  OBJECTIVE   Vitals:  08/31/23 1123  BP: 133/79  Pulse: 80  Temp: 36.6 C (97.9 F)  SpO2: 97%  Weight: 55.3 kg (122 lb)  Height: 153.7 cm (5' 0.5)  PainSc: 0-No pain  PainLoc: Rectum   Body mass index is 23.43 kg/m.  PHYSICAL EXAM:  Constitutional: Not cachectic. Hygeine adequate. Vitals signs as above.  Eyes: No glasses. Vision adequate,Pupils reactive, normal extraocular movements. Sclera nonicteric Neuro: CN II-XII intact. No major focal sensory defects. No major motor deficits. Lymph: No head/neck/groin lymphadenopathy Psych: No severe agitation. No severe anxiety. Judgment & insight Adequate, Oriented x4, HENT: Normocephalic, Mucus membranes moist. No thrush. Hearing: adequate Neck: Supple, No tracheal deviation. No obvious thyromegaly Chest: No pain to chest wall compression. Good respiratory excursion. No audible wheezing CV: Pulses intact. regular. No major extremity edema Ext: No obvious deformity or contracture. Edema: Not present. No cyanosis Skin: No major subcutaneous nodules. Warm and dry Musculoskeletal: Severe joint rigidity not present. No obvious clubbing. No digital petechiae. Mobility: no assist device moving easily without  restrictions  Abdomen: Flat Soft. Nondistended. Nontender. Hernia: Not present. Diastasis recti: Not present. No hepatomegaly. No splenomegaly.  Genital/Pelvic: Inguinal hernia: Not present. Inguinal lymph nodes: without lymphadenopathy nor hidradenitis.   Rectal:  Perianal skin Clean with good hygiene  Pruritis ani: Not present Pilonidal disease: Not present  External hemorrhoids: Not present Condyloma / warts: Not present Anal fissure: Not present Perirectal abscess/fistula: Not present Sphincter tone: Mildly decreased squeeze without anal spasming  Digital and anoscopic rectal exam: Tolerated  Hemorrhoidal piles: Grade 1-2 internal hemorrhoids Prostate: N/A Rectovaginal septum: Thin with mild rectocele Rectal masses: Not present  Other significant findings: Rectal wall prolapse especially anterior circumference more than posterior with mild decubitus straining. 5-6 centimeters of rectum pops out easily. Some irritation of mucosa. No masses palpated to 8 cm by digital rectal exam.  Patient examined with assistance of female Medical Assistant in the room with patient in decubitus position .  ###################################  ###################################################################  Labs, Imaging and Diagnostic Testing:  Located in 'Care Everywhere' section of Epic EMR chart  PRIOR [facility] CLINIC NOTES:  Not applicable  SURGERY NOTES:  Not applicable  PATHOLOGY:  Located in 'Care Everywhere' section of Epic EMR chart  Assessment and Plan:  DIAGNOSES:  Diagnoses and all orders for this visit:  Rectal prolapse  Chronic constipation  Decreased anal sphincter tone     ASSESSMENT/PLAN  Pleasant woman with intermittent rectal prolapse in the setting of chronic constipation. Constipation improved but still having rectal wall prolapse. Biopsies of irritated distal rectum consistent with chronic prolapse and no malignancy.  I think this is more than  just a few hemorrhoids. 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 I think she was a little disheartened that it was going to come to surgery but it has been a worsening problem despite doing Kegel's and improving her constipation and being healthy and active. She wishes to go ahead and proceed.  Doing in September after a planned [place] trip. I did caution that she will need help especially the first few weeks to make sure she is recovering safely and has the support to troubleshoot issues.   the anatomy & physiology of the digestive tract was discussed. The pathophysiology of rectal prolapse was discussed. Natural history risks without surgery was discussed. I feel the risks of no intervention will lead to serious problems that outweigh the operative risks; therefore, I recommended surgery to treat the pathology. Possible need for sigmoid colectomy to remove redundant colon was discussed. Pexy by suture and probable mesh reinforcement was discussed as well. Laparoscopic & open techniques were discussed.   Risks such as bleeding, infection, abscess, leak, reoperation, possible ostomy, hernia, stroke, heart attack, death, and other risks were discussed. I noted a good likelihood this will help address the problem. Goals of post-operative recovery were discussed as well. We will work to minimize complications. An educational handout on the technique was given as well. Questions were answered. The patient expresses understanding & wishes to proceed with surgery.  Elspeth KYM Schultze, MD, FACS, MASCRS Esophageal, Gastrointestinal & Colorectal Surgery Robotic and Minimally Invasive Surgery  [facility] [address], [address] [place], [place] [address] [phone] Fax [phone] Main  CONTACT  INFORMATION: Weekday (9AM-5PM): Call [facility] main office at [phone] Weeknight (5PM-9AM) or Weekend/Holiday: Check EPIC Web Links tab & use AMION (password  [email or web address]) for General Surgery [facility] coverage  Please, DO NOT use SecureChat  (it is not reliable communication to reach operating surgeons & will lead to a delay in care).   Epic staff messaging available for outptient concerns needing 1-2 business day response.     10/20/2023

## 2023-10-20 NOTE — Transfer of Care (Signed)
 Immediate Anesthesia Transfer of Care Note  Patient: Kathy Bennett  Procedure(s) Performed: RECTOPEXY, ROBOT-ASSISTED (Abdomen)  Patient Location: PACU  Anesthesia Type:General  Level of Consciousness: awake, alert , and oriented  Airway & Oxygen Therapy: Patient Spontanous Breathing  Post-op Assessment: Report given to RN and Post -op Vital signs reviewed and stable  Post vital signs: Reviewed and stable  Last Vitals:  Vitals Value Taken Time  BP 119/70 10/20/23 12:39  Temp 36.6 C 10/20/23 12:39  Pulse 60 10/20/23 12:39  Resp 15 10/20/23 12:39  SpO2 100 % 10/20/23 12:39    Last Pain:  Vitals:   10/20/23 1239  TempSrc: Oral  PainSc: 6          Complications: No notable events documented.

## 2023-10-20 NOTE — Anesthesia Procedure Notes (Signed)
 Procedure Name: Intubation Date/Time: 10/20/2023 10:03 AM  Performed by: Gladis Honey, CRNAPre-anesthesia Checklist: Patient identified, Emergency Drugs available, Suction available and Patient being monitored Patient Re-evaluated:Patient Re-evaluated prior to induction Oxygen Delivery Method: Circle System Utilized Preoxygenation: Pre-oxygenation with 100% oxygen Induction Type: IV induction Ventilation: Mask ventilation without difficulty Laryngoscope Size: Miller and 2 Grade View: Grade I Tube type: Oral Tube size: 7.0 mm Number of attempts: 1 Airway Equipment and Method: Stylet and Oral airway Placement Confirmation: ETT inserted through vocal cords under direct vision, positive ETCO2 and breath sounds checked- equal and bilateral Tube secured with: Tape Dental Injury: Teeth and Oropharynx as per pre-operative assessment

## 2023-10-20 NOTE — Discharge Instructions (Signed)
 SURGERY: POST OP INSTRUCTIONS (Surgery for small bowel obstruction, colon resection, etc)   ######################################################################  EAT Gradually transition to a high fiber diet with a fiber supplement over the next few days after discharge  WALK Walk an hour a day.  Control your pain to do that.    CONTROL PAIN Control pain so that you can walk, sleep, tolerate sneezing/coughing, go up/down stairs.  HAVE A BOWEL MOVEMENT DAILY Keep your bowels regular to avoid problems.  OK to try a laxative to override constipation.  OK to use an antidairrheal to slow down diarrhea.  Call if not better after 2 tries  CALL IF YOU HAVE PROBLEMS/CONCERNS Call if you are still struggling despite following these instructions. Call if you have concerns not answered by these instructions  ######################################################################   DIET Follow a light diet the first few days at home.  Start with a bland diet such as soups, liquids, starchy foods, low fat foods, etc.  If you feel full, bloated, or constipated, stay on a ful liquid or pureed/blenderized diet for a few days until you feel better and no longer constipated. Be sure to drink plenty of fluids every day to avoid getting dehydrated (feeling dizzy, not urinating, etc.). Gradually add a fiber supplement to your diet over the next week.  Gradually get back to a regular solid diet.  Avoid fast food or heavy meals the first week as you are more likely to get nauseated. It is expected for your digestive tract to need a few months to get back to normal.  It is common for your bowel movements and stools to be irregular.  You will have occasional bloating and cramping that should eventually fade away.  Until you are eating solid food normally, off all pain medications, and back to regular activities; your bowels will not be normal. Focus on eating a low-fat, high fiber diet the rest of your life  (See Getting to Good Bowel Health, below).  CARE of your INCISION or WOUND  It is good for closed incisions and even open wounds to be washed every day.  Shower every day.  Short baths are fine.  Wash the incisions and wounds clean with soap & water.    You may leave closed incisions open to air if it is dry.   You may cover the incision with clean gauze & replace it after your daily shower for comfort.  TEGADERM:  You have clear gauze band-aid dressings over your closed incision(s).  Remove the dressings 2 days after surgery = 9/12.    If you have an open wound with a wound vac, see wound vac care instructions.    ACTIVITIES as tolerated Start light daily activities --- self-care, walking, climbing stairs-- beginning the day after surgery.  Gradually increase activities as tolerated.  Control your pain to be active.  Stop when you are tired.  Ideally, walk several times a day, eventually an hour a day.   Most people are back to most day-to-day activities in a few weeks.  It takes 4-8 weeks to get back to unrestricted, intense activity. If you can walk 30 minutes without difficulty, it is safe to try more intense activity such as jogging, treadmill, bicycling, low-impact aerobics, swimming, etc. Save the most intensive and strenuous activity for last (Usually 4-8 weeks after surgery) such as sit-ups, heavy lifting, contact sports, etc.  Refrain from any intense heavy lifting or straining until you are off narcotics for pain control.  You will have off  days, but things should improve week-by-week. DO NOT PUSH THROUGH PAIN.  Let pain be your guide: If it hurts to do something, don't do it.  Pain is your body warning you to avoid that activity for another week until the pain goes down. You may drive when you are no longer taking narcotic prescription pain medication, you can comfortably wear a seatbelt, and you can safely make sudden turns/stops to protect yourself without hesitating due to  pain. You may have sexual intercourse when it is comfortable. If it hurts to do something, stop.   MEDICATIONS Take your usually prescribed home medications unless otherwise directed.    Blood thinners:  You can restart any strong blood thinners after the second postoperative day  for example: COUMADIN (warfarin), XERELTO (rivaroxaban), ELIQUIS (apixaban), PLAVIX (clopidigrel), BRILINTA (ticagrelor), EFFIENT (prasugrel), PRADAXA (dabigatran), etc  Continue aspirin before & after surgery..     Some oozing/bleeding the first 1-2 weeks is common but should taper down & be small volume.    If you are passing many large clots or having uncontrolling bleeding, call your surgeon    PAIN CONTROL Pain after surgery or related to activity is often due to strain/injury to muscle, tendon, nerves and/or incisions.  This pain is usually short-term and will improve in a few months.  To help speed the process of healing and to get back to regular activity more quickly, DO THE FOLLOWING THINGS TOGETHER: Increase activity gradually.  DO NOT PUSH THROUGH PAIN Use Ice and/or Heat Try Gentle Massage and/or Stretching Take over the counter pain medication Take Narcotic prescription pain medication for more severe pain  Good pain control = faster recovery.  It is better to take more medicine to be more active than to stay in bed all day to avoid medications.  Increase activity gradually Avoid heavy lifting at first, then increase to lifting as tolerated over the next 6 weeks. Do not "push through" the pain.  Listen to your body and avoid positions and maneuvers than reproduce the pain.  Wait a few days before trying something more intense Walking an hour a day is encouraged to help your body recover faster and more safely.  Start slowly and stop when getting sore.  If you can walk 30 minutes without stopping or pain, you can try more intense activity (running, jogging, aerobics, cycling, swimming,  treadmill, sex, sports, weightlifting, etc.) Remember: If it hurts to do it, then don't do it! Use Ice and/or Heat You will have swelling and bruising around the incisions.  This will take several weeks to resolve. Ice packs or heating pads (6-8 times a day, 30-60 minutes at a time) will help sooth soreness & bruising. Some people prefer to use ice alone, heat alone, or alternate between ice & heat.  Experiment and see what works best for you.  Consider trying ice for the first few days to help decrease swelling and bruising; then, switch to heat to help relax sore spots and speed recovery. Shower every day.  Short baths are fine.  It feels good!  Keep the incisions and wounds clean with soap & water.   Try Gentle Massage and/or Stretching Massage at the area of pain many times a day Stop if you feel pain - do not overdo it Take over the counter pain medication This helps the muscle and nerve tissues become less irritable and calm down faster Choose ONE of the following over-the-counter anti-inflammatory medications: Acetaminophen  500mg  tabs (Tylenol ) 1-2 pills with every meal  and just before bedtime (avoid if you have liver problems or if you have acetaminophen  in you narcotic prescription) Naproxen 220mg  tabs (ex. Aleve, Naprosyn) 1-2 pills twice a day (avoid if you have kidney, stomach, IBD, or bleeding problems) Ibuprofen 200mg  tabs (ex. Advil, Motrin) 3-4 pills with every meal and just before bedtime (avoid if you have kidney, stomach, IBD, or bleeding problems) Take with food/snack several times a day as directed for at least 2 weeks to help keep pain / soreness down & more manageable. Take Narcotic prescription pain medication for more severe pain A prescription for strong pain control is often given to you upon discharge (for example: oxycodone/Percocet, hydrocodone/Norco/Vicodin, or tramadol /Ultram ) Take your pain medication as prescribed. Be mindful that most narcotic prescriptions  contain Tylenol  (acetaminophen ) as well - avoid taking too much Tylenol . If you are having problems/concerns with the prescription medicine (does not control pain, nausea, vomiting, rash, itching, etc.), please call us  [phone] to see if we need to switch you to a different pain medicine that will work better for you and/or control your side effects better. If you need a refill on your pain medication, you must call the office before 4 pm and on weekdays only.  By federal law, prescriptions for narcotics cannot be called into a pharmacy.  They must be filled out on paper & picked up from our office by the patient or authorized caretaker.  Prescriptions cannot be filled after 4 pm nor on weekends.    WHEN TO CALL US  ([phone] Severe uncontrolled or worsening pain  Fever over 101 F (38.5 C) Concerns with the incision: Worsening pain, redness, rash/hives, swelling, bleeding, or drainage Reactions / problems with new medications (itching, rash, hives, nausea, etc.) Nausea and/or vomiting Difficulty urinating Difficulty breathing Worsening fatigue, dizziness, lightheadedness, blurred vision Other concerns If you are not getting better after two weeks or are noticing you are getting worse, contact our office ([phone] for further advice.  We may need to adjust your medications, re-evaluate you in the office, send you to the emergency room, or see what other things we can do to help. The clinic staff is available to answer your questions during regular business hours (8:30am-5pm).  Please don't hesitate to call and ask to speak to one of our nurses for clinical concerns.    A surgeon from [facility] is always on call at the hospitals 24 hours/day If you have a medical emergency, go to the nearest emergency room or call 911.  FOLLOW UP in our office One the day of your discharge from the hospital (or the next business weekday), please call [facility] to set up  or confirm an appointment to see your surgeon in the office for a follow-up appointment.  Usually it is 2-3 weeks after your surgery.   If you have skin staples at your incision(s), let the office know so we can set up a time in the office for the nurse to remove them (usually around 10 days after surgery). Make sure that you call for appointments the day of discharge (or the next business weekday) from the hospital to ensure a convenient appointment time. IF YOU HAVE DISABILITY OR FAMILY LEAVE FORMS, BRING THEM TO THE OFFICE FOR PROCESSING.  DO NOT GIVE THEM TO YOUR DOCTOR.  [facility] [address], [address], [place], [place]  [address] ? [phone] - Main [phone] - Toll Free,  [phone] - Fax [email or web address]  GETTING TO GOOD BOWEL HEALTH. It is expected for your digestive tract to need a few months to get back to normal.  It is common for your bowel movements and stools to be irregular.  You will have occasional bloating and cramping that should eventually fade away.  Until you are eating solid food normally, off all pain medications, and back to regular activities; your bowels will not be normal.   Avoiding constipation The goal: ONE SOFT BOWEL MOVEMENT A DAY!    Drink plenty of fluids.  Choose water first. TAKE A FIBER SUPPLEMENT EVERY DAY THE REST OF YOUR LIFE During your first week back home, gradually add back a fiber supplement every day Experiment which form you can tolerate.   There are many forms such as powders, tablets, wafers, gummies, etc Psyllium bran (Metamucil), methylcellulose (Citrucel), Miralax  or Glycolax , Benefiber, Flax Seed.  Adjust the dose week-by-week (1/2 dose/day to 6 doses a day) until you are moving your bowels 1-2 times a day.  Cut back the dose or try a different fiber product if it is giving you problems such as diarrhea or bloating. Sometimes a laxative is needed to help jump-start bowels if  constipated until the fiber supplement can help regulate your bowels.  If you are tolerating eating & you are farting, it is okay to try a gentle laxative such as double dose MiraLax , prune juice, or Milk of Magnesia.  Avoid using laxatives too often. Stool softeners can sometimes help counteract the constipating effects of narcotic pain medicines.  It can also cause diarrhea, so avoid using for too long. If you are still constipated despite taking fiber daily, eating solids, and a few doses of laxatives, call our office. Controlling diarrhea Try drinking liquids and eating bland foods for a few days to avoid stressing your intestines further. Avoid dairy products (especially milk & ice cream) for a short time.  The intestines often can lose the ability to digest lactose when stressed. Avoid foods that cause gassiness or bloating.  Typical foods include beans and other legumes, cabbage, broccoli, and dairy foods.  Avoid greasy, spicy, fast foods.  Every person has some sensitivity to other foods, so listen to your body and avoid those foods that trigger problems for you. Probiotics (such as active yogurt, Align, etc) may help repopulate the intestines and colon with normal bacteria and calm down a sensitive digestive tract Adding a fiber supplement gradually can help thicken stools by absorbing excess fluid and retrain the intestines to act more normally.  Slowly increase the dose over a few weeks.  Too much fiber too soon can backfire and cause cramping & bloating. It is okay to try and slow down diarrhea with a few doses of antidiarrheal medicines.   Bismuth subsalicylate (ex. Kayopectate, Pepto Bismol) for a few doses can help control diarrhea.  Avoid if pregnant.   Loperamide  (Imodium ) can slow down diarrhea.  Start with one tablet (2mg ) first.  Avoid if you are having fevers or severe pain.  ILEOSTOMY PATIENTS WILL HAVE CHRONIC DIARRHEA since their colon is not in use.    Drink plenty of liquids.   You will need to drink even more glasses of water/liquid a day to avoid getting dehydrated. Record output from your ileostomy.  Expect to empty the bag every 3-4 hours at first.  Most people with a permanent ileostomy empty their bag 4-6 times at the least.   Use antidiarrheal medicine (especially Imodium ) several times a day to avoid getting dehydrated.  Start with a dose at bedtime & breakfast.  Adjust up or down as needed.  Increase antidiarrheal medications as directed to avoid emptying the bag more than 8 times a day (every 3 hours). Work with your wound ostomy nurse to learn care for your ostomy.  See ostomy care instructions. TROUBLESHOOTING IRREGULAR BOWELS 1) Start with a soft & bland diet. No spicy, greasy, or fried foods.  2) Avoid gluten/wheat or dairy products from diet to see if symptoms improve. 3) Miralax  17gm or flax seed mixed in 8oz. water or juice-daily. May use 2-4 times a day as needed. 4) Gas-X, Phazyme, etc. as needed for gas & bloating.  5) Prilosec (omeprazole) over-the-counter as needed 6)  Consider probiotics (Align, Activa, etc) to help calm the bowels down  Call your doctor if you are getting worse or not getting better.  Sometimes further testing (cultures, endoscopy, X-ray studies, CT scans, bloodwork, etc.) may be needed to help diagnose and treat the cause of the diarrhea. [facility] [address], [place], [place]  [address] [phone] - Main.    [phone]  - Toll Free.   [phone] - Fax [email or web address]   Pelvic floor muscle training exercises (Kegels) can help strengthen the muscles under the uterus, bladder, and bowel (large intestine). They can help both men and women who have problems with urine leakage or bowel control.  A pelvic floor muscle training exercise is like pretending that you have to urinate, and then holding it. You relax and tighten the muscles that control urine flow. It's  important to find the right muscles to tighten.  The next time you have to urinate, start to go and then stop. Feel the muscles in your vagina, bladder, or anus get tight and move up. These are the pelvic floor muscles. If you feel them tighten, you've done the exercise right. If you are still not sure whether you are tightening the right muscles, keep in mind that all of the muscles of the pelvic floor relax and contract at the same time. Because these muscles control the bladder, rectum, and vagina, the following tips may help: Women: Insert a finger into your vagina. Tighten the muscles as if you are holding in your urine, then let go. You should feel the muscles tighten and move up and down.  Men: Insert a finger into your rectum. Tighten the muscles as if you are holding in your urine, then let go. You should feel the muscles tighten and move up and down. These are the same muscles you would tighten if you were trying to prevent yourself from passing gas.  It is very important that you keep the following muscles relaxed while doing pelvic floor muscle training exercises: Abdominal  Buttocks (the deeper, anal sphincter muscle should contract)  Thigh   A woman can also strengthen these muscles by using a vaginal cone, which is a weighted device that is inserted into the vagina. Then you try to tighten the pelvic floor muscles to hold the device in place. If you are unsure whether you are doing the pelvic floor muscle training correctly, you can use biofeedback and electrical stimulation to help find the correct muscle group to work. Biofeedback is a method of positive reinforcement. Electrodes are placed on the abdomen and along the anal area. Some therapists place a sensor in the vagina in women or anus in men to monitor the contraction of pelvic floor muscles.  A monitor will display a  graph showing which muscles are contracting and which are at rest. The therapist can help find the right muscles  for performing pelvic floor muscle training exercises.   PERFORMING PELVIC FLOOR EXERCISES: 1. Begin by emptying your bladder. 2. Tighten the pelvic floor muscles and hold for a count of 10. 3. Relax the muscles completely for a count of 10. 4. Do 10 repititions, 3 to 5 times a day (morning, afternoon, and night). You can do these exercises at any time and any place. Most people prefer to do the exercises while lying down or sitting in a chair. After 4 - 6 weeks, most people notice some improvement. It may take as long as 3 months to see a major change. After a couple of weeks, you can also try doing a single pelvic floor contraction at times when you are likely to leak (for example, while getting out of a chair). A word of caution: Some people feel that they can speed up the progress by increasing the number of repetitions and the frequency of exercises. However, over-exercising can instead cause muscle fatigue and increase urine leakage. If you feel any discomfort in your abdomen or back while doing these exercises, you are probably doing them wrong. Breathe deeply and relax your body when you are doing these exercises. Make sure you are not tightening your stomach, thigh, buttock, or chest muscles. When done the right way, pelvic floor muscle exercises have been shown to be very effective at improving urinary continence.  Pelvic Floor Pain / Incontinence  Do you suffer from pelvic pain or incontinence? Do you have pain in the pelvis, low back or hips that is associated with sitting, walking, urination or intercourse? Have you experienced leaking of urine or feces when coughing, sneezing or laughing? Do you have pain in the pelvic area associated with cancer?  These are conditions that are common with pelvic floor muscle dysfunction. Over time, due to stress, scar tissue, surgeries and the natural course of aging, our muscles may become weak or overstressed and can spasm. This can lead to pain,  weakness, incontinence or decreased quality of life.  Men and women with pelvic floor dysfunction frequently describe:  A "falling out" feeling. Pain or burning in the abdomen, tailbone or perineal area. Constipation or bowel elimination problems or difficulty initiating urination. Unresolved low back or hip pain. Frequency and urgency when going to the bathroom. Leaking of urine or feces. Pain with intercourse.  [email or web address]  To make a referral or for more information about [facility] Pelvic Floor Therapy Program, call  [facility]) - [phone] [place] [phone] [place]) - [phone] [place] [facility]) - [phone]

## 2023-10-20 NOTE — Op Note (Signed)
 10/20/2023  10:49 AM  PATIENT:  Kathy Bennett  68 y.o. female  Patient Care Team: Rosamond Leta NOVAK, MD as PCP - General (Internal Medicine) Sheldon Standing, MD as Consulting Physician (General Surgery) Cindie Carlin POUR, DO as Consulting Physician (Gastroenterology)  PRE-OPERATIVE DIAGNOSIS:  RECTAL PROLAPSE  POST-OPERATIVE DIAGNOSIS:  RECTAL PROLAPSE  PROCEDURE:  RECTOPEXY, ROBOT-ASSISTED  SURGEON:  Standing KYM Sheldon, MD  ASSISTANT:  Medford Pizza, MD  An experienced assistant was required given the standard of surgical care given the complexity of the case.  This assistant was needed for exposure, dissection, suction, tissue approximation, retraction, perception, etc  ANESTHESIA:  General endotracheal intubation anesthesia (GETA) and Regional TRANSVERSUS ABDOMINIS PLANE (TAP) nerve block -BILATERAL for perioperative & postoperative pain control at the level of the transverse abdominis & preperitoneal spaces along the flank at the anterior axillary line, from subcostal ridge to iliac crest under laparoscopic guidance provided with 60 mL of bupivicaine 0.25% with epinephrine   Estimated Blood Loss (EBL):   Total I/O In: 100 [IV Piggyback:100] Out: 525 [Urine:500; Blood:25].   (See anesthesia record)  Delay start of Pharmacological VTE agent (>24hrs) due to concerns of significant anemia, surgical blood loss, or risk of bleeding?:  no  DRAINS: (None) and 19 Fr Blake drain the tip resting in the pelvis  SPECIMEN:  (no specimen)  DISPOSITION OF SPECIMEN:  Pathology and (not applicable)  COUNTS:  Sponge, needle, & instrument counts CORRECT at the conclusion of the case.      PLAN OF CARE: Admit to inpatient   PATIENT DISPOSITION:  PACU - hemodynamically stable.  INDICATION:    Active elderly woman with worsening episodes of rectal prolapse.  Chronic constipation she is under better control but now having constant procidentia.  Recommendation made for minimally invasive exploration  with rectopexy and possible segmental resection.  The anatomy & physiology of the digestive tract was discussed.  The pathophysiology of rectal prolapse was discussed.  Natural history risks without surgery was discussed.   I feel the risks of no intervention will lead to serious problems that outweigh the operative risks; therefore, I recommended surgery to treat the pathology.  Possible need for sigmoid colectomy to remove redundant colon was discussed.  Pexy by suture and probable mesh reinforcement was discussed as well.  Laparoscopic & open techniques were discussed.   Risks such as bleeding, infection, abscess, leak, reoperation, possible ostomy, hernia, stroke, heart attack, death, and other risks were discussed.   I noted a good likelihood this will help address the problem.  Goals of post-operative recovery were discussed as well.  We will work to minimize complications.  An educational handout on the technique was given as well.  Questions were answered.  The patient expresses understanding & wishes to proceed with surgery.   OR FINDINGS:   Patient had redundant floppy rectosigmoid colon with prolapse about the anus.  Not particularly increase in length of left colon so sigmoid resection not needed.  Standard rectopexy down to sacral promontory  No obvious metastatic disease on visceral parietal peritoneum or liver.  Type of patient?: Elective [facility] Private Case Status of Case? Elective Scheduled Infection Present At Time Of Surgery (PATOS)?  NO  PROCEDURE:  Informed consent was confirmed.  Patient underwent general anesthesia without any difficulty & was positioned in low lithotomy with arms tucked.  The patient had a Foley catheter sterilely placed.  The abdomen and perineum were prepped and draped in sterile fashion.  Surgical time-out confirmed our plan.  I placed a 8-mm robotic port in the left upper quadrant using Varess technique with the patient in steep reverse Trendelenburg and  left sideup.  The patient had orogastric tube for decompression.  Entry was clean.  We induced carbon dioxide insufflation.  Port placed.  Extra ports placed carefully under direct visualization.  1 small needle puncture noted on the left lateral sector of the liver with a few millimeters of blood that it clotted and spontaneous resolved.  No injury elsewhere.  We positioned the patient head down and evacuated the greater omentum and the upper abdomen.  Went ahead and docked the [employer] carefully.  I turned attention to the pelvis.  Freed off the rectum and sigmoid adhesions to the left anterior pelvis adnexa and uterus to help straighten it out and get it out of the pelvis.  I elevated the redundant rectosigmoid colon.  Not a particularly large length.  Elevated this very stretched out redundant rectosigmoid colon and cephalad to place the mesentery on tension.  I scored the peritoneum at the rectosigmoid region at the level of the sacral promontory and continued scoring of peritoneum down to the right peritoneal reflection.  I left the rectosigmoid mesentery anteriorly and got into the presacral plane.  We focused on careful blunt and focused vessel sealer blunt dissection down the sacrum and down towards the levators for good posterior rectal mobilzation.    I identified the ureters, kept them along with the ureter and gonadal vessels and the natural retroperitoneal position undisturbed.   We held off on any left colon retroperitoneal dissection to avoid disturbing any further natural adhesions of the left colon to the left pericolic gutter.   I then took some of the peritoneal reflection on the left rectosigmoid side going up the pelvic brim to the low anterior reflection. Freed the visceral peritoneum off the right and left rectal walls, preserving the lateral feeding vessels to continue more distally in, around the anterior peritoneal reflection.  I then came around anteriorly to help mobilize the  anterior rectum off its anterior attachments to the rectovaginal septum all the way down to the pelvic floor for good anterior rectal mobilization.  I went back to mobilizing the posterior mesorectum off the presacral space.  Carefully skeletonized and freed off the distal mesorectum off the pelvis.  Worked to free off the posterior midline anococcygeal ligament gradually elevated and was able to reduce the chronic rectal prolapse back in to the pelvis.  Alternated between anterior and posterior dissection, sparing the lateral pedicles.  With that, I could get the mid rectum to stretch up to the sacral promontory to help straighten out the stretched out rectum. I did digital rectal exam to confirm that I had good dissection anteriorly and posteriorly down to the sphincter complex with good mobilization. We had worked to spare lateral blood supply.  I made an effort to keep the lateral pedicles intact on both sides at mid and distal rectum.  I because there was not that much redundancy I did not see much benefit to doing the rectosigmoid resection and left the sigmoid intact.  I then focused on rectopexy. I then used 0 Prolene suture through the left lateral sacral promontory and the left posterior mid rectal wall and tied that down. Did a similar mirror-image stitch on the right side of the sacral promontory to the right posterior rectal stump.  The sutures provided good posterolateral rectopexy in the mid rectum to the sacral promontory. I then used  2-0 V lock and ran on the distal lateral pelvis peritoneal reflection and ran proximally up the left paracolic gutter.  Did some suturing to the sacral promontory on the left side to provide an additional rectopexy, sparing of the lateral vessels.  Left an opening proximally to allow a 19 [place] Blake drain to come in the left posterior pelvis and go down and fill the posterior pelvis well.  Redundancy coming between the rectum and the adnexa uteri.  Continue running  that suture to the left lower quadrant retroperitoneum line of Toldt.  And then the suture on the right side to help tack the rectovaginal septum to the sacrum as well using 2-0 running V-Loc suture.  Writing that and getting that stitch to the right retroperitoneum.  Some redundant tissue helping cover up the rectosigmoid.  This provided good rectopexy.  Sigmoid colon had a natural sweep without any concern for volvulus or torsion with a broad rectosigmoid pexing left lateral and right mid posterior.  We inspected the abdomen and pelvis cavity..  Hemostasis was good.   Endoluminal gas was evacuated.  Ports & wound protector removed.   Sterile unused instruments were used from this point.  I closed the skin at the port sites using Monocryl stitch and sterile dressing.  I closed the fascia at the 8 mm robotic port sites with interrupted 0 Vicryl suture given the fact that she had very thin tissues to prevent port site hernia.  Drain site secured with Prolene suture  I closed the skin with interrupted Monocryl stitches.  I placed sterile dressings.     Patient is being extubated go to recovery room. I had discussed postop care with the patient in detail the office & in the holding area. Instructions are written. I discussed operative findings, updated the patient's status, discussed probable steps to recovery, and gave postoperative recommendations to the patient's spouse, KARINE GARN.  Recommendations were made.  Questions were answered.  He expressed understanding & appreciation.    Elspeth KYM Schultze, M.D., F.A.C.S. Gastrointestinal and Minimally Invasive Surgery [facility] [address] [place], [place] [address] [phone] Main / Paging   10/20/2023

## 2023-10-21 ENCOUNTER — Encounter (HOSPITAL_COMMUNITY): Payer: Self-pay | Admitting: Surgery

## 2023-10-21 LAB — CBC
HCT: 38.1 % (ref 36.0–46.0)
Hemoglobin: 12.2 g/dL (ref 12.0–15.0)
MCH: 29 pg (ref 26.0–34.0)
MCHC: 32 g/dL (ref 30.0–36.0)
MCV: 90.5 fL (ref 80.0–100.0)
Platelets: 236 K/uL (ref 150–400)
RBC: 4.21 MIL/uL (ref 3.87–5.11)
RDW: 14.6 % (ref 11.5–15.5)
WBC: 12.7 K/uL — ABNORMAL HIGH (ref 4.0–10.5)
nRBC: 0 % (ref 0.0–0.2)

## 2023-10-21 LAB — BASIC METABOLIC PANEL WITH GFR
Anion gap: 11 (ref 5–15)
BUN: 7 mg/dL — ABNORMAL LOW (ref 8–23)
CO2: 25 mmol/L (ref 22–32)
Calcium: 9.6 mg/dL (ref 8.9–10.3)
Chloride: 105 mmol/L (ref 98–111)
Creatinine, Ser: 0.8 mg/dL (ref 0.44–1.00)
GFR, Estimated: >60 mL/min
Glucose, Bld: 113 mg/dL — ABNORMAL HIGH (ref 70–99)
Potassium: 5.4 mmol/L — ABNORMAL HIGH (ref 3.5–5.1)
Sodium: 141 mmol/L (ref 135–145)

## 2023-10-21 LAB — MAGNESIUM: Magnesium: 2.2 mg/dL (ref 1.7–2.4)

## 2023-10-21 NOTE — Progress Notes (Signed)
   10/21/23 1249  TOC Brief Assessment  Insurance and Status Reviewed  Patient has primary care physician Yes  Home environment has been reviewed home with spouse  Prior level of function: independent  Prior/Current Home Services No current home services  Social Drivers of Health Review SDOH reviewed no interventions necessary  Readmission risk has been reviewed Yes  Transition of care needs no transition of care needs at this time

## 2023-10-21 NOTE — Progress Notes (Signed)
 10/21/2023  Kathy Bennett [id number] 1955-09-01  CARE TEAM: PCP: Rosamond Leta NOVAK, MD  Outpatient Care Team: Patient Care Team: Rosamond Leta NOVAK, MD as PCP - General (Internal Medicine) Sheldon Standing, MD as Consulting Physician (General Surgery) Cindie Carlin POUR, DO as Consulting Physician (Gastroenterology)  Inpatient Treatment Team: Treatment Team:  Sheldon Standing, MD [place], [place], NT Ragaas, Leander JONETTA, RN Purgason, Vina NOVAK, RRT Heyward Rosaline CROME, LPN Shiela Handler D, NT   Problem List:   Principal Problem:   Complete rectal prolapse   10/20/2023  POST-OPERATIVE DIAGNOSIS:  RECTAL PROLAPSE   PROCEDURE:  RECTOPEXY, ROBOT-ASSISTED   SURGEON:  Standing KYM Sheldon, MD  OR FINDINGS:   Patient had redundant floppy rectosigmoid colon with prolapse about the anus.  Not particularly increase in length of left colon so sigmoid resection not needed.  Standard rectopexy down to sacral promontory  Assessment [facility] Stay = 1 days) 1 Day Post-Op    Recovering relatively well so far    Plan:  ERAS protocol  Follow off IV fluids.  PRN (as needed) bolus as needed.  Patient thirsty so encouraged her to drink fluids to feel better hydrated  Soft diet.  Standard solid diet later today.  -monitor electrolytes & replace as needed.  Mild hyperkalemia without any elevated creatinine.  Should self resolve off IV fluids. Keep K>4, Mg>2, Phos>3  -VTE prophylaxis- SCDs.  Anticoagulation prophyllaxis SQ as appropriate  -mobilize as tolerated to help recovery.  Enlist therapies in moderate/high risk patients as appropriate.  She seemed tenuous about trying to get up so we will see if physical therapy has any concerns.  Check orthostatics.  Often people can leave postop day 1 but she is feeling puny this morning.  Will see if she feels better this evening.  Surgical drain in pelvis.  That can come out upon discharge home  I updated the patient's status to the patient  Recommendations  were made.  Questions were answered.  She expressed understanding & appreciation.  -Disposition:  Disposition:  The patient is from: Home Anticipate discharge to:  Home Anticipated Date of Discharge is:  September 12,2025   Barriers to discharge:  Pending Clinical improvement (more likely than not) and Therapy assessment & Recommendations pending  Patient currently is NOT MEDICALLY STABLE for discharge from the hospital from a surgery standpoint.      I reviewed last 24 h vitals and pain scores, last 48 h intake and output, last 24 h labs and trends, and last 24 h imaging results.  I have reviewed this patient's available data, including medical history, events of note, test results, etc as part of my evaluation.   A significant portion of that time was spent in counseling. Care during the described time interval was provided by me.  This care required moderate level of medical decision making.  10/21/2023    Subjective: (Chief complaint)  Patient felt lightheaded with numerous loose bowel movements while trying to get out of bed last night, so stayed in bed.  Very thirsty.  No nausea.  Wants to do more than drink liquids.  Denies much pain.  Objective:  Vital signs:  Vitals:   10/20/23 1550 10/20/23 1829 10/20/23 2121 10/21/23 0523  BP: 100/66 111/65 113/67 (!) 105/93  Pulse: 71 76 75 69  Resp: 18 18 17 17   Temp: 98.7 F (37.1 C) 98.6 F (37 C) 98 F (36.7 C) 98 F (36.7 C)  TempSrc: Oral Oral Oral Oral  SpO2: 100% 100% 99%  99%  Weight:      Height:        Last BM Date : 10/20/23 (before surgery)  Intake/Output   Yesterday:  09/10 0701 - 09/11 0700 In: 2219.2 [P.O.:720; I.V.:1289.2; IV Piggyback:200] Out: 3345 [Urine:3250; Drains:70; Blood:25] This shift:  No intake/output data recorded.  Bowel function:  Flatus: YES  BM:  YES  Drain: Serosanguinous   Physical Exam:  General: Pt awake/alert in no acute distress Eyes: PERRL, normal EOM.  Sclera  clear.  No icterus Neuro: CN II-XII intact w/o focal sensory/motor deficits. Lymph: No head/neck/groin lymphadenopathy Psych:  No delerium/psychosis/paranoia.  Somewhat nervous and anxious but mostly consolable.  Oriented x 4 HENT: Normocephalic, Mucus membranes moist.  No thrush Neck: Supple, No tracheal deviation.  No obvious thyromegaly Chest: No pain to chest wall compression.  Good respiratory excursion.  No audible wheezing CV:  Pulses intact.  Regular rhythm.  No major extremity edema MS: Normal AROM mjr joints.  No obvious deformity  Abdomen: Soft.  Nondistended.  Mildly tender at incisions only.  No evidence of peritonitis.  No incarcerated hernias.  Ext:  No deformity.  No mjr edema.  No cyanosis Skin: No petechiae / purpurea.  No major sores.  Warm and dry    Results:   Cultures: No results found for this or any previous visit (from the past 720 hours).  Labs: Results for orders placed or performed during the hospital encounter of 10/20/23 (from the past 48 hours)  ABO/Rh     Status: None   Collection Time: 10/20/23  7:55 AM  Result Value Ref Range   ABO/RH(D)      O POS Performed at [facility], [address], [place], [place] [address]   Basic metabolic panel     Status: Abnormal   Collection Time: 10/21/23  4:41 AM  Result Value Ref Range   Sodium 141 135 - 145 mmol/L   Potassium 5.4 (H) 3.5 - 5.1 mmol/L   Chloride 105 98 - 111 mmol/L   CO2 25 22 - 32 mmol/L   Glucose, Bld 113 (H) 70 - 99 mg/dL    Comment: Glucose reference range applies only to samples taken after fasting for at least 8 hours.   BUN 7 (L) 8 - 23 mg/dL   Creatinine, Ser 9.19 0.44 - 1.00 mg/dL   Calcium  9.6 8.9 - 10.3 mg/dL   GFR, Estimated >39 >39 mL/min    Comment: (NOTE) Calculated using the CKD-EPI Creatinine Equation (2021)    Anion gap 11 5 - 15    Comment: Performed at [facility], [address] [place], [place] [address]  CBC     Status:  Abnormal   Collection Time: 10/21/23  4:41 AM  Result Value Ref Range   WBC 12.7 (H) 4.0 - 10.5 K/uL   RBC 4.21 3.87 - 5.11 MIL/uL   Hemoglobin 12.2 12.0 - 15.0 g/dL   HCT 61.8 63.9 - 53.9 %   MCV 90.5 80.0 - 100.0 fL   MCH 29.0 26.0 - 34.0 pg   MCHC 32.0 30.0 - 36.0 g/dL   RDW 85.3 88.4 - 84.4 %   Platelets 236 150 - 400 K/uL   nRBC 0.0 0.0 - 0.2 %    Comment: Performed at [facility], [address], [place], [place] [address]  Magnesium     Status: None   Collection Time: 10/21/23  4:41 AM  Result Value Ref Range   Magnesium 2.2  1.7 - 2.4 mg/dL    Comment: Performed at [facility], [address] [place], [place] [address]    Imaging / Studies: No results found.  Medications / Allergies: per chart  Antibiotics: Anti-infectives (From admission, onward)    Start     Dose/Rate Route Frequency Ordered Stop   10/20/23 1800  cefoTEtan  (CEFOTAN ) 2 g in sodium chloride  0.9 % 100 mL IVPB        2 g 200 mL/hr over 30 Minutes Intravenous Every 12 hours 10/20/23 1200 10/20/23 1744   10/20/23 1400  neomycin  (MYCIFRADIN ) tablet 1,000 mg  Status:  Discontinued       Placed in And Linked Group   1,000 mg Oral 3 times per day 10/20/23 0636 10/20/23 0640   10/20/23 1400  metroNIDAZOLE  (FLAGYL ) tablet 1,000 mg  Status:  Discontinued       Placed in And Linked Group   1,000 mg Oral 3 times per day 10/20/23 0636 10/20/23 0640   10/20/23 0645  ertapenem  (INVANZ ) 1 g in sodium chloride  0.9 % 100 mL IVPB        1 g 200 mL/hr over 30 Minutes Intravenous On call to O.R. 10/20/23 0636 10/20/23 9076         Note: Portions of this report may have been transcribed using voice recognition software. Every effort was made to ensure accuracy; however, inadvertent computerized transcription errors may be present.   Any transcriptional errors that result from this process are unintentional.    Elspeth KYM Schultze, MD, FACS, MASCRS Esophageal,  Gastrointestinal & Colorectal Surgery Robotic and Minimally Invasive Surgery  [facility] [address] [place], [place] [address] [phone] Fax [phone] Main  CONTACT INFORMATION: Weekday (9AM-5PM): Call [facility] main office at [phone] Weeknight (5PM-9AM) or Weekend/Holiday: Check EPIC Web Links tab & use AMION (password  [email or web address]) for General Surgery [facility] coverage  Please, DO NOT use SecureChat  (it is not reliable communication to reach operating surgeons & will lead to a delay in care).   Epic staff messaging available for outptient concerns needing 1-2 business day response.      10/21/2023  7:51 AM

## 2023-10-21 NOTE — Progress Notes (Signed)
 Pt had multiple liquidy stools tonight. Pt was able to get up and sit on to the commode. Pt complained of light headedness and nausea. PRN zofran  given.

## 2023-10-22 MED ORDER — LOPERAMIDE HCL 2 MG PO CAPS: 2.0000 mg | ORAL_CAPSULE | Freq: Four times a day (QID) | ORAL | 5 refills | Status: AC | PRN

## 2023-10-22 MED ORDER — POLYETHYLENE GLYCOL 3350 17 G PO PACK: 17.0000 g | PACK | Freq: Every day | ORAL | 0 refills | Status: AC | PRN

## 2023-10-22 MED ORDER — TRAMADOL HCL 50 MG PO TABS: 50.0000 mg | ORAL_TABLET | Freq: Four times a day (QID) | ORAL | 0 refills | Status: AC | PRN

## 2023-10-22 NOTE — Progress Notes (Signed)
 PT Cancellation Note  Patient Details Name: Kathy Bennett MRN: [id number] DOB: Jun 06, 1955   Cancelled Treatment:    Reason Eval/Treat Not Completed: PT screened, no needs identified, will sign off Noted documentation that pt ambulating independently - RN confirmed pt ambulating frequently on her own in hallway.  Checked with pt and she reports getting up/down and walking without too much difficulty.  States she has RW, rollator, and other DME if needed.  Denies therapy needs. PT will sign off.  [name], PT Acute Rehab [facility] [phone]   [name] 10/22/2023, 11:45 AM

## 2023-10-22 NOTE — Progress Notes (Signed)
 Discharge instructions discussed with patient and family, verbalized agreement and understanding

## 2023-10-22 NOTE — Discharge Summary (Addendum)
 Physician Discharge Summary    Kathy Bennett MRN: [id number] DOB/AGE: [birth year] = 69 y.o.  Patient Care Team: Rosamond Leta NOVAK, MD as PCP - General (Internal Medicine) Sheldon Standing, MD as Consulting Physician (General Surgery) Cindie Carlin POUR, DO as Consulting Physician (Gastroenterology)  Admit date: 10/20/2023  Discharge date: 10/22/2023  Hospital Stay = 2 days    Discharge Diagnoses:  Principal Problem:   Complete rectal prolapse   2 Days Post-Op  10/20/2023  POST-OPERATIVE DIAGNOSIS:   RECTAL PROLAPSE  SURGERY:  10/20/2023  Procedure(s): RECTOPEXY, ROBOT-ASSISTED  SURGEON:    Surgeon(s): Sheldon Standing, MD Teresa Lonni HERO, MD  Consults: Anesthesia  Hospital Course:   The patient underwent the surgery above.  Postoperatively, the patient gradually mobilized and advanced to a solid diet.  Pain and other symptoms were treated aggressively.    By the time of discharge, the patient was walking well the hallways, eating food, having flatus.  Pain was well-controlled on an oral medications.  Based on meeting discharge criteria and continuing to recover, I felt it was safe for the patient to be discharged from the hospital to further recover with close followup. Postoperative recommendations were discussed in detail.  They are written as well.  Discharged Condition: good  Discharge Exam: Blood pressure 118/66, pulse 66, temperature 98.5 F (36.9 C), resp. rate 16, height 5' (1.524 m), weight 54.9 kg, SpO2 99%.  General: Pt awake/alert/oriented x4 in No acute distress Eyes: PERRL, normal EOM.  Sclera clear.  No icterus Neuro: CN II-XII intact w/o focal sensory/motor deficits. Lymph: No head/neck/groin lymphadenopathy Psych:  No delerium/psychosis/paranoia HENT: Normocephalic, Mucus membranes moist.  No thrush Neck: Supple, No tracheal deviation Chest:  No chest wall pain w good excursion CV:  Pulses intact.  Regular rhythm MS: Normal AROM mjr joints.  No  obvious deformity Abdomen: Soft.  Nondistended.  Mildly tender at incisions only.  No evidence of peritonitis.  No incarcerated hernias. Ext:  SCDs BLE.  No mjr edema.  No cyanosis Skin: No petechiae / purpura   Disposition:    Follow-up Information     Sheldon Standing, MD Follow up in 1 month(s).   Specialties: General Surgery, Colon and Rectal Surgery Why: To follow up after your operation Contact information: [address] [place] [address] [phone]                 Discharge disposition: 01-Home or Self Care       Discharge Instructions     Call MD for:   Complete by: As directed    FEVER > 101.5 F  (temperatures < 101.5 F are not significant)   Call MD for:  extreme fatigue   Complete by: As directed    Call MD for:  persistant dizziness or light-headedness   Complete by: As directed    Call MD for:  persistant nausea and vomiting   Complete by: As directed    Call MD for:  redness, tenderness, or signs of infection (pain, swelling, redness, odor or green/yellow discharge around incision site)   Complete by: As directed    Call MD for:  severe uncontrolled pain   Complete by: As directed    Diet - low sodium heart healthy   Complete by: As directed    Start with a bland diet such as soups, liquids, starchy foods, low fat foods, etc. the first few days at home. Gradually advance to a solid, low-fat, high fiber diet by the end of  the first week at home.   Add a fiber supplement to your diet (Metamucil, etc) If you feel full, bloated, or constipated, stay on a full liquid or pureed/blenderized diet for a few days until you feel better and are no longer constipated.   Discharge instructions   Complete by: As directed    See Discharge Instructions If you are not getting better after two weeks or are noticing you are getting worse, contact our office [phone] for further advice.  We may need to adjust your medications, re-evaluate you  in the office, send you to the emergency room, or see what other things we can do to help. The clinic staff is available to answer your questions during regular business hours (8:30am-5pm).  Please don't hesitate to call and ask to speak to one of our nurses for clinical concerns.    A surgeon from [facility] is always on call at the hospitals 24 hours/day If you have a medical emergency, go to the nearest emergency room or call 911.   Discharge wound care:   Complete by: As directed    It is good for closed incisions and even open wounds to be washed every day.  Shower every day.  Short baths are fine.  Wash the incisions and wounds clean with soap & water.    You may leave closed incisions open to air if it is dry.   You may cover the incision with clean gauze & replace it after your daily shower for comfort.  TEGADERM:  You have clear gauze band-aid dressings over your closed incision(s).  Remove the dressings 2 days after surgery.   Driving Restrictions   Complete by: As directed    You may drive when: - you are no longer taking narcotic prescription pain medication - you can comfortably wear a seatbelt - you can safely make sudden turns/stops without pain.   Increase activity slowly   Complete by: As directed    Start light daily activities --- self-care, walking, climbing stairs- beginning the day after surgery.  Gradually increase activities as tolerated.  Control your pain to be active.  Stop when you are tired.  Ideally, walk several times a day, eventually an hour a day.   Most people are back to most day-to-day activities in a few weeks.  It takes 4-6 weeks to get back to unrestricted, intense activity. If you can walk 30 minutes without difficulty, it is safe to try more intense activity such as jogging, treadmill, bicycling, low-impact aerobics, swimming, etc. Save the most intensive and strenuous activity for last (Usually 4-8 weeks after surgery) such as sit-ups,  heavy lifting, contact sports, etc.  Refrain from any intense heavy lifting or straining until you are off narcotics for pain control.  You will have off days, but things should improve week-by-week. DO NOT PUSH THROUGH PAIN.  Let pain be your guide: If it hurts to do something, don't do it.   Lifting restrictions   Complete by: As directed    If you can walk 30 minutes without difficulty, it is safe to try more intense activity such as jogging, treadmill, bicycling, low-impact aerobics, swimming, etc. Save the most intensive and strenuous activity for last (Usually 4-8 weeks after surgery) such as sit-ups, heavy lifting, contact sports, etc.   Refrain from any intense heavy lifting or straining until you are off narcotics for pain control.  You will have off days, but things should improve week-by-week. DO NOT PUSH THROUGH  PAIN.  Let pain be your guide: If it hurts to do something, don't do it.  Pain is your body warning you to avoid that activity for another week until the pain goes down.   May shower / Bathe   Complete by: As directed    May walk up steps   Complete by: As directed    Remove dressing in 48 hours   Complete by: As directed    Make sure all dressings have been removed on the second day after surgery = 9/12 Friday Leave incisions open to air.  OK to cover incisions with gauze or bandages as desired   Sexual Activity Restrictions   Complete by: As directed    You may have sexual intercourse when it is comfortable. If it hurts to do something, stop.       Allergies as of 10/22/2023       Reactions   Penicillins Hives   Azithromycin Hives        Medication List     TAKE these medications    calcium  carbonate 1500 (600 Ca) MG Tabs tablet Commonly known as: OSCAL Take 600 mg by mouth in the morning.   cholecalciferol  25 MCG (1000 UNIT) tablet Commonly known as: VITAMIN D3 Take 1,000 Units by mouth in the morning.   latanoprost  0.005 % ophthalmic  solution Commonly known as: XALATAN  Place 1 drop into both eyes at bedtime.   loperamide  2 MG capsule Commonly known as: IMODIUM  Take 1 capsule (2 mg total) by mouth every 6 (six) hours as needed for diarrhea or loose stools.   polyethylene glycol 17 g packet Commonly known as: MIRALAX  / GLYCOLAX  Take 17 g by mouth daily as needed. What changed:  when to take this reasons to take this   Refresh Relieva 0.5-0.9 % ophthalmic solution Generic drug: carboxymethylcellul-glycerin  Place 1-2 drops into both eyes in the morning and at bedtime.   traMADol  50 MG tablet Commonly known as: ULTRAM  Take 1-2 tablets (50-100 mg total) by mouth every 6 (six) hours as needed for moderate pain (pain score 4-6) or severe pain (pain score 7-10).   zolpidem  5 MG tablet Commonly known as: AMBIEN  Take 2.5 mg by mouth at bedtime as needed for sleep.               Discharge Care Instructions  (From admission, onward)           Start     Ordered   10/20/23 0000  Discharge wound care:       Comments: It is good for closed incisions and even open wounds to be washed every day.  Shower every day.  Short baths are fine.  Wash the incisions and wounds clean with soap & water.    You may leave closed incisions open to air if it is dry.   You may cover the incision with clean gauze & replace it after your daily shower for comfort.  TEGADERM:  You have clear gauze band-aid dressings over your closed incision(s).  Remove the dressings 2 days after surgery.   10/20/23 0805            Significant Diagnostic Studies:  Results for orders placed or performed during the hospital encounter of 10/20/23 (from the past 72 hours)  ABO/Rh     Status: None   Collection Time: 10/20/23  7:55 AM  Result Value Ref Range   ABO/RH(D)      O POS Performed at [facility],  [address], [place], [place] [address]   Basic metabolic panel     Status: Abnormal   Collection Time: 10/21/23   4:41 AM  Result Value Ref Range   Sodium 141 135 - 145 mmol/L   Potassium 5.4 (H) 3.5 - 5.1 mmol/L   Chloride 105 98 - 111 mmol/L   CO2 25 22 - 32 mmol/L   Glucose, Bld 113 (H) 70 - 99 mg/dL    Comment: Glucose reference range applies only to samples taken after fasting for at least 8 hours.   BUN 7 (L) 8 - 23 mg/dL   Creatinine, Ser 9.19 0.44 - 1.00 mg/dL   Calcium  9.6 8.9 - 10.3 mg/dL   GFR, Estimated >39 >39 mL/min    Comment: (NOTE) Calculated using the CKD-EPI Creatinine Equation (2021)    Anion gap 11 5 - 15    Comment: Performed at [facility], [address], [place], [place] [address]  CBC     Status: Abnormal   Collection Time: 10/21/23  4:41 AM  Result Value Ref Range   WBC 12.7 (H) 4.0 - 10.5 K/uL   RBC 4.21 3.87 - 5.11 MIL/uL   Hemoglobin 12.2 12.0 - 15.0 g/dL   HCT 61.8 63.9 - 53.9 %   MCV 90.5 80.0 - 100.0 fL   MCH 29.0 26.0 - 34.0 pg   MCHC 32.0 30.0 - 36.0 g/dL   RDW 85.3 88.4 - 84.4 %   Platelets 236 150 - 400 K/uL   nRBC 0.0 0.0 - 0.2 %    Comment: Performed at [facility], [address] [place], [place] [address]  Magnesium     Status: None   Collection Time: 10/21/23  4:41 AM  Result Value Ref Range   Magnesium 2.2 1.7 - 2.4 mg/dL    Comment: Performed at [facility], [address], [place], [place] [address]    No results found.  Past Medical History:  Diagnosis Date   Arthritis    lower back   Chronic constipation    GERD (gastroesophageal reflux disease)    no meds   Rectal prolapse     Past Surgical History:  Procedure Laterality Date   BIOPSY  01/21/2021   Procedure: BIOPSY;  Surgeon: Cindie Carlin POUR, DO;  Location: [facility];  Service: Endoscopy;;   COLONOSCOPY     COLONOSCOPY WITH PROPOFOL  N/A 01/21/2021   Procedure: COLONOSCOPY WITH PROPOFOL ;  Surgeon: Cindie Carlin POUR, DO;  Location: [facility];  Service: Endoscopy;  Laterality: N/A;  9:45 / ASA I/ II   EYE  SURGERY     XI ROBOT ASSISTED RECTOPEXY N/A 10/20/2023   Procedure: RECTOPEXY, ROBOT-ASSISTED;  Surgeon: Sheldon Standing, MD;  Location: [facility];  Service: General;  Laterality: N/A;    Social History   Socioeconomic History   Marital status: Married    Spouse name: Not on file   Number of children: Not on file   Years of education: Not on file   Highest education level: Not on file  Occupational History   Not on file  Tobacco Use   Smoking status: Never    Passive exposure: Past   Smokeless tobacco: Never  Vaping Use   Vaping status: Never Used  Substance and Sexual Activity   Alcohol  use: Yes    Comment: 1-2 x a month   Drug use: Never   Sexual activity: Not Currently  Other Topics Concern   Not on  file  Social History Narrative   Not on file   Social Drivers of Health   Financial Resource Strain: Not on file  Food Insecurity: No Food Insecurity (10/20/2023)   Hunger Vital Sign    Worried About Running Out of Food in the Last Year: Never true    Ran Out of Food in the Last Year: Never true  Transportation Needs: No Transportation Needs (10/20/2023)   PRAPARE - Administrator, Civil Service (Medical): No    Lack of Transportation (Non-Medical): No  Physical Activity: Not on file  Stress: Not on file  Social Connections: Socially Integrated (10/20/2023)   Social Connection and Isolation Panel    Frequency of Communication with Friends and Family: More than three times a week    Frequency of Social Gatherings with Friends and Family: More than three times a week    Attends Religious Services: More than 4 times per year    Active Member of [employer] or Organizations: Yes    Attends Banker Meetings: More than 4 times per year    Marital Status: Married  Catering manager Violence: Not At Risk (10/20/2023)   Humiliation, Afraid, Rape, and Kick questionnaire    Fear of Current or Ex-Partner: No    Emotionally Abused: No    Physically Abused: No     Sexually Abused: No    History reviewed. No pertinent family history.  Current Facility-Administered Medications  Medication Dose Route Frequency Provider Last Rate Last Admin   0.9 %  sodium chloride  infusion  250 mL Intravenous PRN Sheldon Standing, MD       acetaminophen  (TYLENOL ) tablet 1,000 mg  1,000 mg Oral Q6H Ailen Strauch, MD   1,000 mg at 10/22/23 1351   alum & mag hydroxide-simeth (MAALOX/MYLANTA) 200-200-20 MG/5ML suspension 30 mL  30 mL Oral Q6H PRN Sheldon Standing, MD       artificial tears ophthalmic solution 1 drop  1 drop Both Eyes BID Sheldon Standing, MD   1 drop at 10/22/23 1120   calcium  carbonate (OS-CAL - dosed in mg of elemental calcium ) tablet 1,250 mg  1 tablet Oral Q breakfast Chastidy Ranker, Standing, MD       cholecalciferol  (VITAMIN D3) 25 MCG (1000 UNIT) tablet 1,000 Units  1,000 Units Oral Daily Sheldon Standing, MD   1,000 Units at 10/22/23 1116   diphenhydrAMINE  (BENADRYL ) 12.5 MG/5ML elixir 12.5 mg  12.5 mg Oral Q6H PRN Sheldon Standing, MD       Or   diphenhydrAMINE  (BENADRYL ) injection 12.5 mg  12.5 mg Intravenous Q6H PRN Sheldon Standing, MD       enoxaparin  (LOVENOX ) injection 40 mg  40 mg Subcutaneous Q24H Sheldon Standing, MD   40 mg at 10/22/23 0750   feeding supplement (ENSURE SURGERY) liquid 237 mL  237 mL Oral BID BM Sheldon Standing, MD   237 mL at 10/22/23 1410   gabapentin  (NEURONTIN ) capsule 200 mg  200 mg Oral QHS Jeremie Giangrande, MD   200 mg at 10/21/23 2227   hydrALAZINE  (APRESOLINE ) injection 10 mg  10 mg Intravenous Q2H PRN Sheldon Standing, MD       HYDROmorphone  (DILAUDID ) injection 0.5-2 mg  0.5-2 mg Intravenous Q4H PRN Sheldon Standing, MD   1 mg at 10/21/23 1215   latanoprost  (XALATAN ) 0.005 % ophthalmic solution 1 drop  1 drop Both Eyes QHS Jessaca Philippi, MD   1 drop at 10/21/23 2229   magic mouthwash  15 mL Oral QID PRN Sheldon Standing, MD  menthol -cetylpyridinium (CEPACOL) lozenge 3 mg  1 lozenge Oral PRN Sheldon Standing, MD       metoprolol  tartrate (LOPRESSOR )  injection 5 mg  5 mg Intravenous Q6H PRN Sheldon Standing, MD       naphazoline-glycerin  (CLEAR EYES REDNESS) ophth solution 1-2 drop  1-2 drop Both Eyes QID PRN Sheldon Standing, MD       ondansetron  (ZOFRAN ) tablet 4 mg  4 mg Oral Q6H PRN Sheldon Standing, MD       Or   ondansetron  (ZOFRAN ) injection 4 mg  4 mg Intravenous Q6H PRN Sheldon Standing, MD   4 mg at 10/21/23 0532   phenol (CHLORASEPTIC) mouth spray 2 spray  2 spray Mouth/Throat PRN Sheldon Standing, MD       polycarbophil (FIBERCON) tablet 625 mg  625 mg Oral BID Sheldon Standing, MD   625 mg at 10/22/23 1116   prochlorperazine  (COMPAZINE ) tablet 10 mg  10 mg Oral Q6H PRN Sheldon Standing, MD       Or   prochlorperazine  (COMPAZINE ) injection 5-10 mg  5-10 mg Intravenous Q6H PRN Sheldon Standing, MD       simethicone  (MYLICON) chewable tablet 40 mg  40 mg Oral Q6H PRN Sheldon Standing, MD   40 mg at 10/22/23 1351   sodium chloride  (OCEAN) 0.65 % nasal spray 1-2 spray  1-2 spray Each Nare Q6H PRN Sheldon Standing, MD       sodium chloride  flush (NS) 0.9 % injection 3 mL  3 mL Intravenous Q12H Trinna Kunst, MD   3 mL at 10/21/23 2230   sodium chloride  flush (NS) 0.9 % injection 3 mL  3 mL Intravenous PRN Sheldon Standing, MD       traMADol  (ULTRAM ) tablet 50-100 mg  50-100 mg Oral Q6H PRN Sheldon Standing, MD   50 mg at 10/22/23 0750   zolpidem  (AMBIEN ) tablet 2.5 mg  2.5 mg Oral QHS PRN Sheldon Standing, MD         Allergies  Allergen Reactions   Penicillins Hives   Azithromycin Hives    Signed:   Standing KYM Sheldon, MD, FACS, MASCRS Esophageal, Gastrointestinal & Colorectal Surgery Robotic and Minimally Invasive Surgery  [facility] [address], [address] [place], [place] [address] [phone] Fax [phone] Main  CONTACT INFORMATION: Weekday (9AM-5PM): Call [facility] main office at [phone] Weeknight (5PM-9AM) or Weekend/Holiday: Check EPIC Web Links tab & use AMION (password  [email or web address]) for  General Surgery [facility] coverage  Please, DO NOT use SecureChat  (it is not reliable communication to reach operating surgeons & will lead to a delay in care).   Epic staff messaging available for outptient concerns needing 1-2 business day response.      10/22/2023, 2:53 PM

## 2023-11-24 DIAGNOSIS — M545 Low back pain, unspecified: Secondary | ICD-10-CM | POA: Diagnosis not present

## 2023-12-20 ENCOUNTER — Other Ambulatory Visit (HOSPITAL_COMMUNITY): Payer: Self-pay | Admitting: Internal Medicine

## 2023-12-20 DIAGNOSIS — Z1231 Encounter for screening mammogram for malignant neoplasm of breast: Secondary | ICD-10-CM

## 2024-01-03 ENCOUNTER — Encounter: Payer: Self-pay | Admitting: Internal Medicine

## 2024-01-03 DIAGNOSIS — M5136 Other intervertebral disc degeneration, lumbar region with discogenic back pain only: Secondary | ICD-10-CM | POA: Diagnosis not present

## 2024-01-12 ENCOUNTER — Ambulatory Visit (HOSPITAL_COMMUNITY)

## 2024-01-24 DIAGNOSIS — E2839 Other primary ovarian failure: Secondary | ICD-10-CM | POA: Diagnosis not present

## 2024-01-24 DIAGNOSIS — H524 Presbyopia: Secondary | ICD-10-CM | POA: Diagnosis not present

## 2024-01-24 DIAGNOSIS — H52223 Regular astigmatism, bilateral: Secondary | ICD-10-CM | POA: Diagnosis not present

## 2024-01-26 ENCOUNTER — Encounter (HOSPITAL_COMMUNITY): Payer: Self-pay

## 2024-01-26 ENCOUNTER — Inpatient Hospital Stay (HOSPITAL_COMMUNITY): Admission: RE | Admit: 2024-01-26 | Discharge: 2024-01-26 | Attending: Internal Medicine | Admitting: Internal Medicine

## 2024-01-26 DIAGNOSIS — Z1231 Encounter for screening mammogram for malignant neoplasm of breast: Secondary | ICD-10-CM | POA: Insufficient documentation
